# Patient Record
Sex: Male | Born: 1980 | Race: White | Hispanic: No | Marital: Single
Health system: Midwestern US, Community
[De-identification: ages and names within clinical notes are randomized; demographics above are authoritative.]

---

## 2004-12-27 IMAGING — CR DG LUMBAR SPINE COMPLETE 4+V
5 series · 5 of 5 positions shown · non-contrast
Comparison: None.

CLINICAL DATA: Low back pain, status post fall. 
 LUMBAR SPINE, COMPLETE

[view not recorded (1 of 5)]
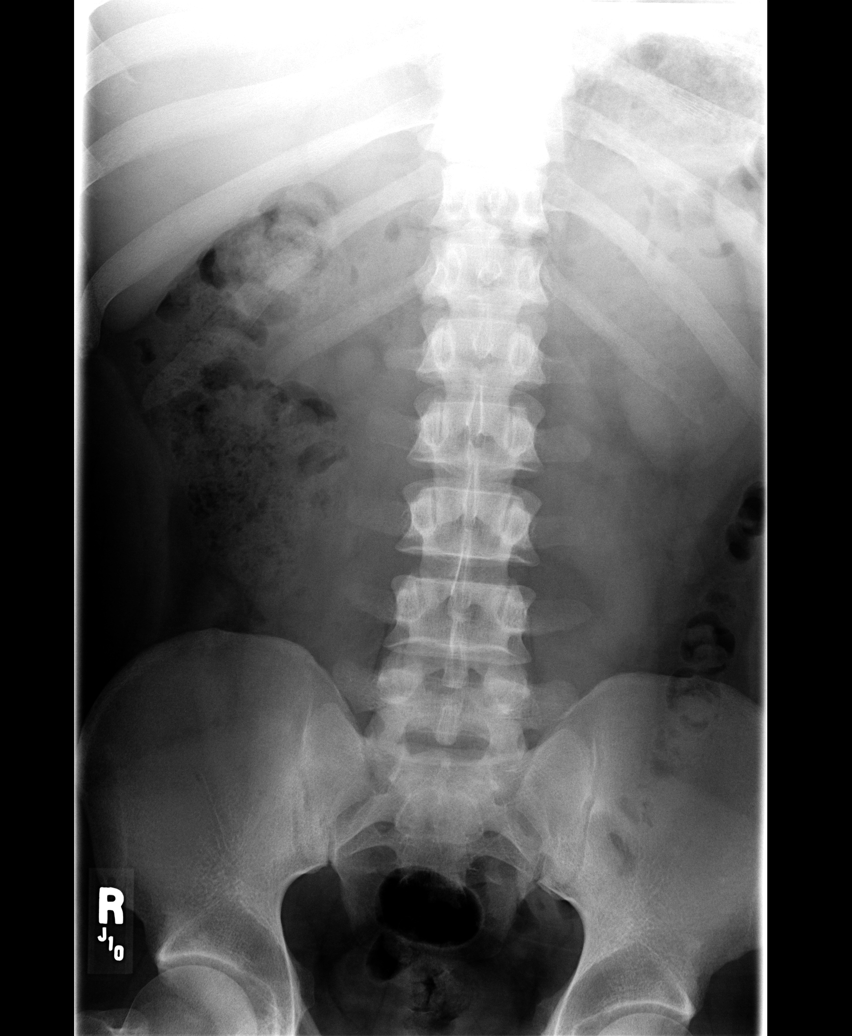

[view not recorded (2 of 5)]
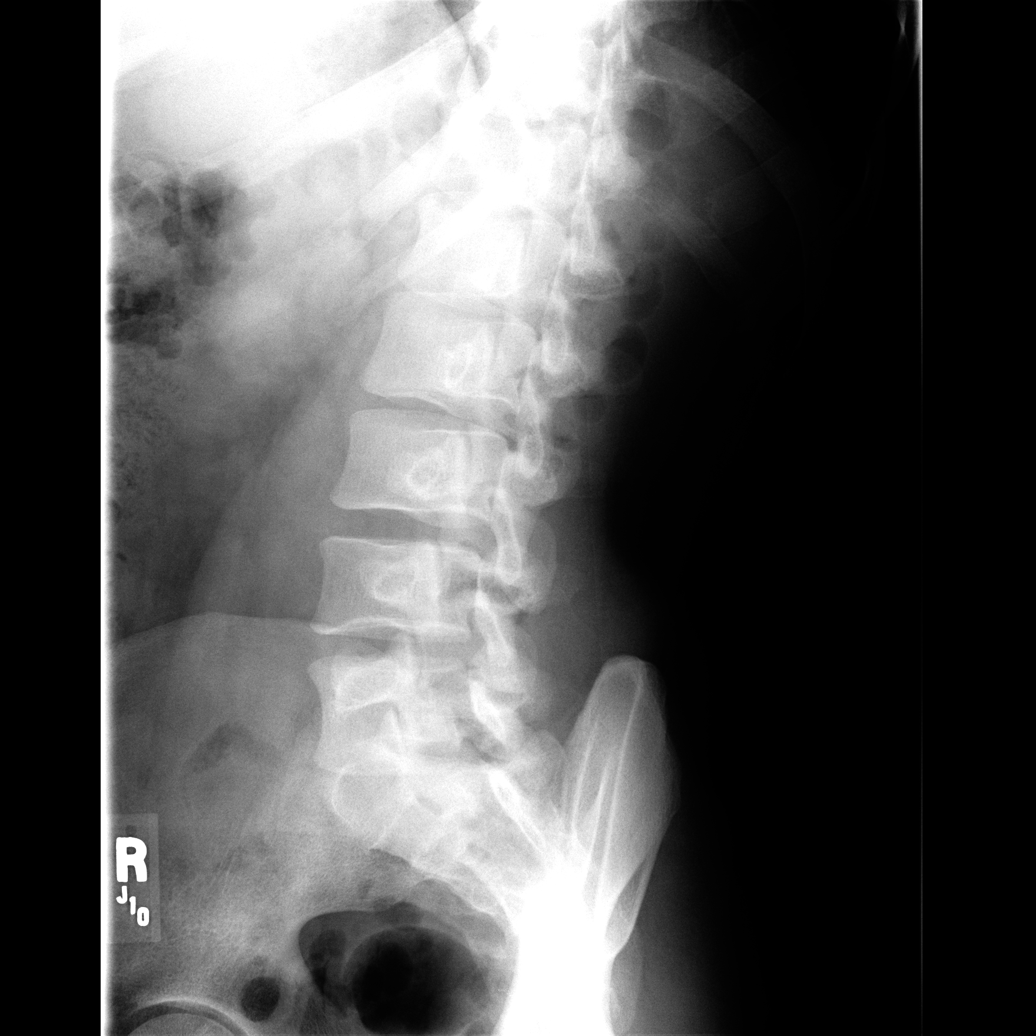

[view not recorded (3 of 5)]
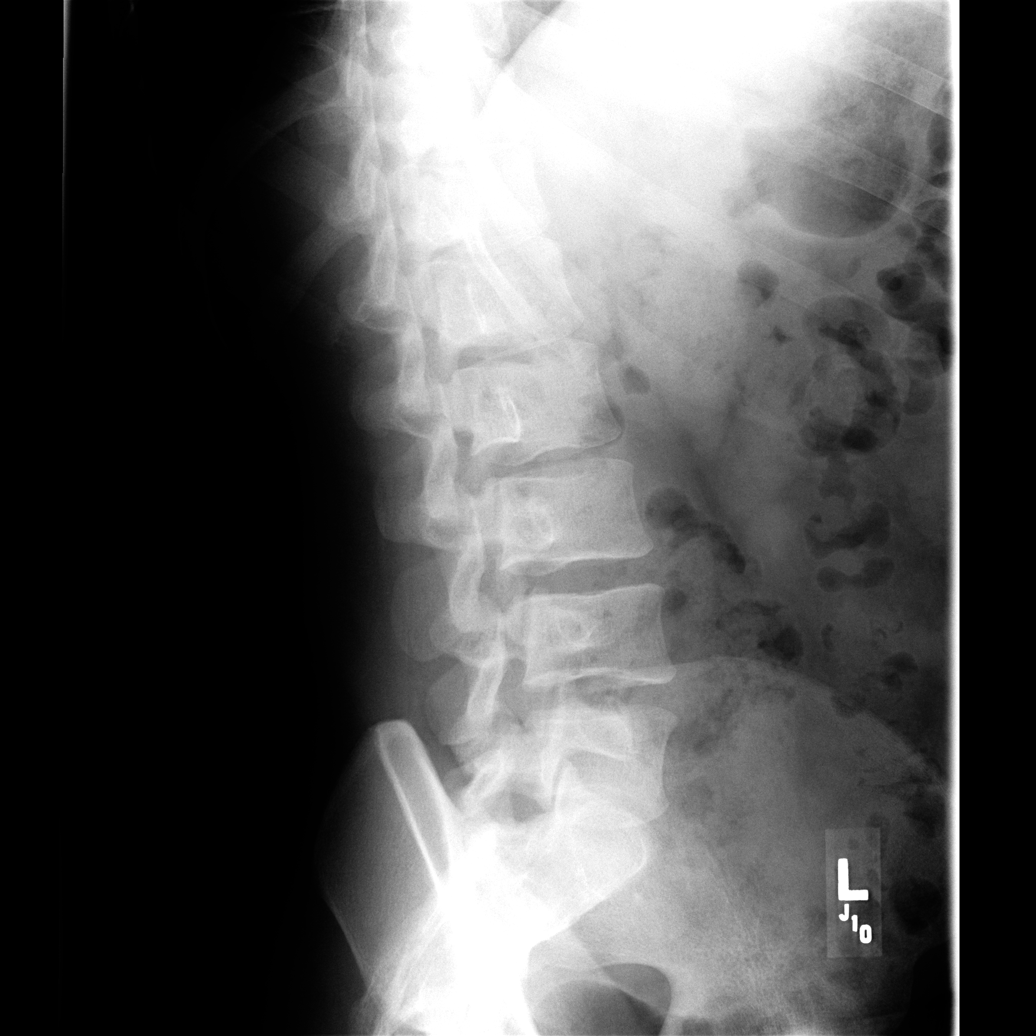

[view not recorded (4 of 5)]
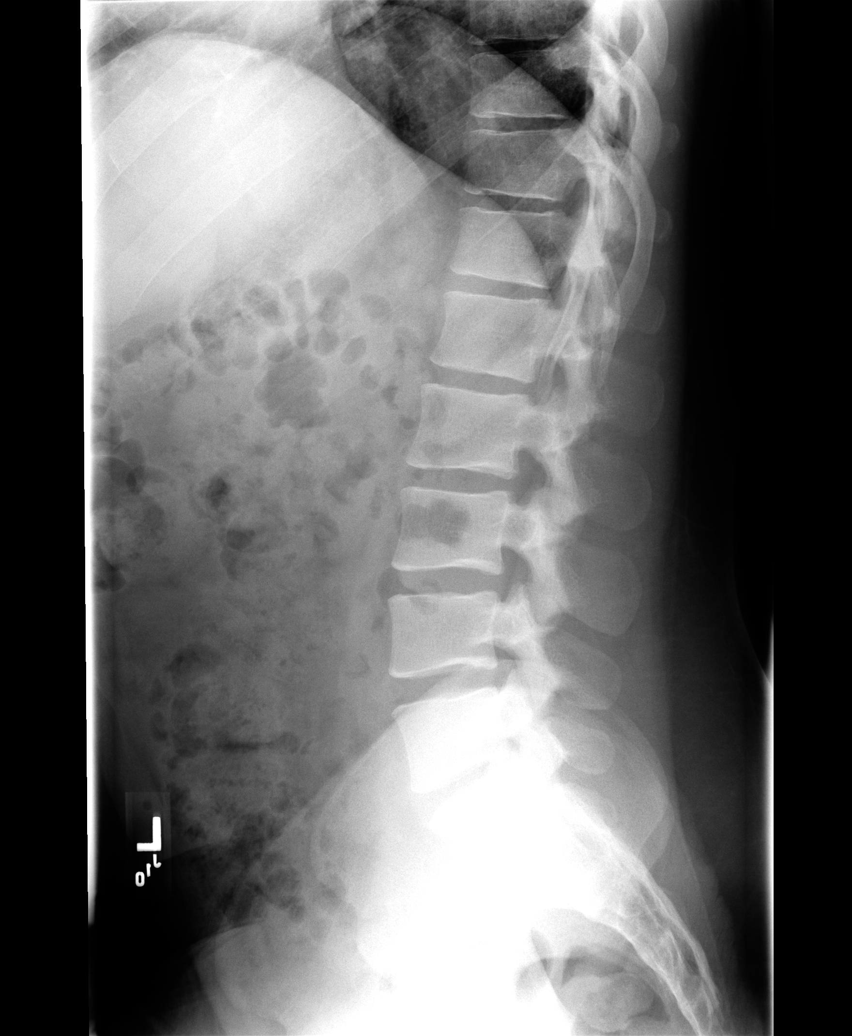

[view not recorded (5 of 5)]
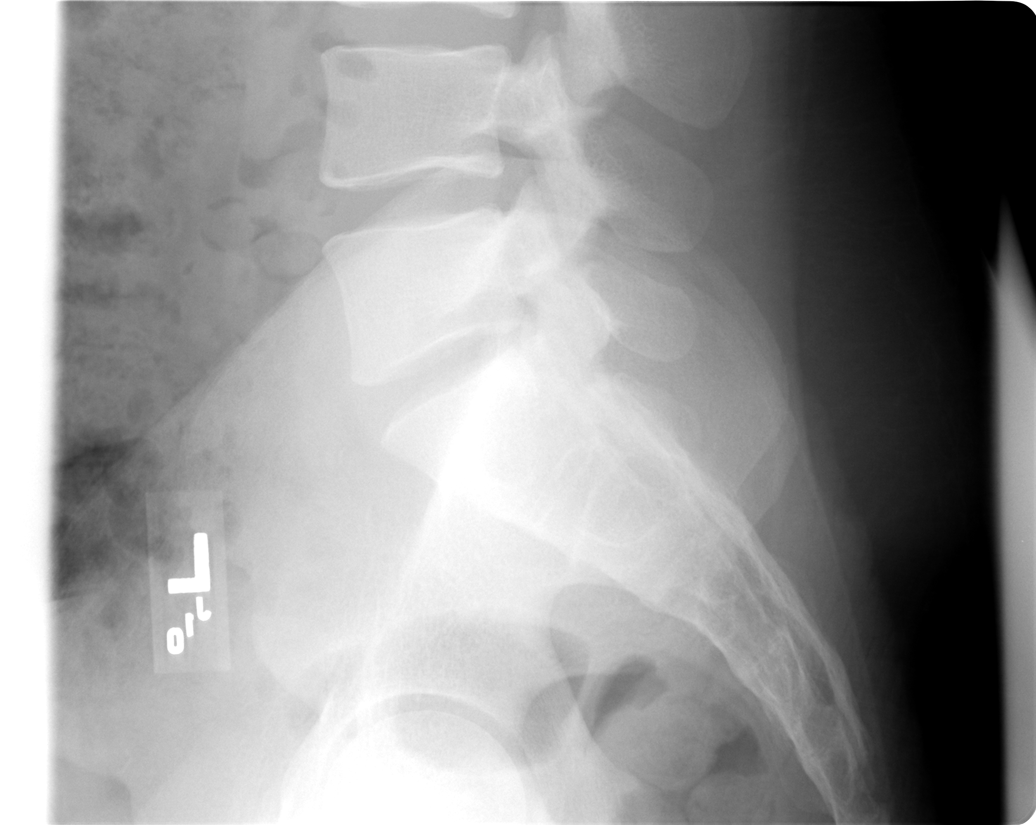

[5 of 5 positions shown; findings below may reference images not displayed]

There are five lumbar type vertebral bodies in anatomic alignment.  The disc spaces are preserved.  There is no evidence of acute fracture or dislocation. 
 IMPRESSION
 Normal exam.

## 2011-11-09 MED ORDER — HYDROMORPHONE (PF) 1 MG/ML IJ SOLN
1 mg/mL | INTRAMUSCULAR | Status: AC
Start: 2011-11-09 — End: 2011-11-09
  Administered 2011-11-09: 18:00:00 via INTRAMUSCULAR

## 2011-11-09 MED ORDER — IBUPROFEN 400 MG TAB
400 mg | ORAL | Status: AC
Start: 2011-11-09 — End: 2011-11-09
  Administered 2011-11-09: 18:00:00 via ORAL

## 2011-11-09 MED ORDER — HYDROMORPHONE (PF) 1 MG/ML IJ SOLN
1 mg/mL | INTRAMUSCULAR | Status: AC
Start: 2011-11-09 — End: 2011-11-09
  Administered 2011-11-09: 19:00:00 via INTRAMUSCULAR

## 2011-11-09 MED ORDER — OXYCODONE-ACETAMINOPHEN 5 MG-325 MG TAB
5-325 mg | ORAL_TABLET | Freq: Four times a day (QID) | ORAL | Status: AC | PRN
Start: 2011-11-09 — End: ?

## 2011-11-09 MED ORDER — IBUPROFEN 800 MG TAB
800 mg | ORAL_TABLET | Freq: Three times a day (TID) | ORAL | Status: AC | PRN
Start: 2011-11-09 — End: ?

## 2011-11-09 MED FILL — HYDROMORPHONE (PF) 1 MG/ML IJ SOLN: 1 mg/mL | INTRAMUSCULAR | Qty: 1
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
## 2011-11-09 NOTE — ED Notes (Signed)
Discharge instructions given to pt.  All questions answered and pt verbalized understanding.   V/S stable @ time of discharge.  Pt ambulatory out of unit.
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
## 2011-11-15 NOTE — ED Provider Notes (Signed)
I was personally available for consultation in the emergency department.  I have reviewed the chart and agree with the documentation recorded by the MLP, including the assessment, treatment plan, and disposition.  Barnett Elzey C Magdelena Kinsella, MD

## 2018-03-16 ENCOUNTER — Inpatient Hospital Stay
Admit: 2018-03-16 | Discharge: 2018-03-16 | Disposition: A | Discharge status: Home / Self Care | Payer: Medicaid Other | Attending: Emergency Medicine

## 2018-03-16 ENCOUNTER — Emergency Department: Admit: 2018-03-16 | Payer: Medicaid Other

## 2018-03-16 DIAGNOSIS — R1110 Vomiting, unspecified: Secondary | ICD-10-CM

## 2018-03-16 LAB — CBC WITH AUTO DIFFERENTIAL
Basophils %: 1 % (ref 0–2)
Basophils Absolute: 0.1 10*3/uL (ref 0.0–0.2)
Eosinophils %: 2 % (ref 1–4)
Eosinophils Absolute: 0.2 10*3/uL (ref 0.0–0.4)
Hematocrit: 48.8 % (ref 41–53)
Hemoglobin: 16.9 g/dL (ref 13.5–17.5)
Lymphocytes Absolute: 2.7 10*3/uL (ref 1.0–4.8)
Lymphocytes: 32 % (ref 24–44)
MCH: 31.4 pg (ref 26–34)
MCHC: 34.6 g/dL (ref 31–37)
MCV: 90.7 fL (ref 80–100)
MPV: 9.9 fL (ref 6.0–12.0)
Monocytes %: 11 % (ref 2–11)
Monocytes Absolute: 0.9 10*3/uL (ref 0.1–1.2)
Neutrophils Absolute: 4.5 10*3/uL (ref 1.8–7.7)
Platelets: 297 10*3/uL (ref 140–450)
RBC: 5.38 m/uL (ref 4.5–5.9)
RDW: 13.9 % (ref 12.5–15.4)
Seg Neutrophils: 54 % (ref 36–66)
WBC: 8.4 10*3/uL (ref 3.5–11.0)

## 2018-03-16 LAB — HEPATIC FUNCTION PANEL
ALT: 29 U/L (ref 5–41)
AST: 21 U/L (ref <40)
Albumin/Globulin Ratio: 1.3 (ref 1.0–2.5)
Albumin: 4.3 g/dL (ref 3.5–5.2)
Alkaline Phosphatase: 121 U/L (ref 40–129)
Bilirubin, Direct: 0.21 mg/dL (ref <0.31)
Bilirubin, Indirect: 0.72 mg/dL (ref 0.00–1.00)
Total Bilirubin: 0.93 mg/dL (ref 0.3–1.2)
Total Protein: 7.5 g/dL (ref 6.4–8.3)

## 2018-03-16 LAB — MICROSCOPIC URINALYSIS
Casts UA: 2 /[LPF]
Epithelial Cells, UA: 2 /[HPF]
RBC, UA: 0 /[HPF]
WBC, UA: 2 /[HPF]

## 2018-03-16 LAB — URINALYSIS WITH REFLEX TO CULTURE
Glucose, Ur: NEGATIVE
Leukocyte Esterase, Urine: NEGATIVE
Nitrite, Urine: NEGATIVE
Protein, UA: NEGATIVE — AB
Specific Gravity, UA: 1.015
Urine Hgb: NEGATIVE
Urobilinogen, Urine: ELEVATED — AB
pH, UA: 8

## 2018-03-16 LAB — BASIC METABOLIC PANEL
Anion Gap: 11 mmol/L (ref 9–17)
BUN: 12 mg/dL (ref 6–20)
CO2: 26 mmol/L (ref 20–31)
Calcium: 9.2 mg/dL (ref 8.6–10.4)
Chloride: 103 mmol/L (ref 98–107)
Creatinine: 0.83 mg/dL (ref 0.70–1.20)
GFR African American: >60 mL/min (ref >60)
GFR Non-African American: >60 mL/min (ref >60)
Glucose: 102 mg/dL — ABNORMAL HIGH (ref 70–99)
Potassium: 3.7 mmol/L (ref 3.7–5.3)
Sodium: 140 mmol/L (ref 135–144)

## 2018-03-16 LAB — AMYLASE: Amylase: 58 U/L (ref 28–100)

## 2018-03-16 LAB — LIPASE: Lipase: 17 U/L (ref 13–60)

## 2018-03-16 MED ORDER — ONDANSETRON 4 MG PO TBDP
4 MG | ORAL_TABLET | Freq: Three times a day (TID) | ORAL | 0 refills | Status: DC | PRN
Start: 2018-03-16 — End: 2018-04-11

## 2018-03-16 MED ORDER — KETOROLAC TROMETHAMINE 15 MG/ML IJ SOLN
15 MG/ML | Freq: Once | INTRAMUSCULAR | Status: AC
Start: 2018-03-16 — End: 2018-03-16
  Administered 2018-03-16: 14:00:00 15 mg via INTRAVENOUS

## 2018-03-16 MED ORDER — ONDANSETRON HCL 4 MG/2ML IJ SOLN
42 MG/2ML | Freq: Once | INTRAMUSCULAR | Status: AC
Start: 2018-03-16 — End: 2018-03-16
  Administered 2018-03-16: 14:00:00 4 mg via INTRAVENOUS

## 2018-03-16 MED ORDER — SODIUM CHLORIDE 0.9 % IV BOLUS
0.9 % | Freq: Once | INTRAVENOUS | Status: AC
Start: 2018-03-16 — End: 2018-03-16
  Administered 2018-03-16: 14:00:00 1000 mL via INTRAVENOUS

## 2018-03-16 MED ORDER — MORPHINE SULFATE 4 MG/ML IJ SOLN
4 MG/ML | Freq: Once | INTRAMUSCULAR | Status: AC
Start: 2018-03-16 — End: 2018-03-16
  Administered 2018-03-16: 15:00:00 4 mg via INTRAVENOUS

## 2018-03-16 MED ORDER — SODIUM CHLORIDE 0.9 % IV BOLUS
0.9 % | Freq: Once | INTRAVENOUS | Status: AC
Start: 2018-03-16 — End: 2018-03-16
  Administered 2018-03-16: 15:00:00 1000 mL via INTRAVENOUS

## 2018-03-16 MED ORDER — HYDROCODONE-ACETAMINOPHEN 5-325 MG PO TABS
5-325 MG | ORAL_TABLET | Freq: Four times a day (QID) | ORAL | 0 refills | Status: AC | PRN
Start: 2018-03-16 — End: 2018-03-19

## 2018-03-16 MED FILL — KETOROLAC TROMETHAMINE 15 MG/ML IJ SOLN: 15 mg/mL | INTRAMUSCULAR | Qty: 1

## 2018-03-16 MED FILL — MORPHINE SULFATE 4 MG/ML IJ SOLN: 4 mg/mL | INTRAMUSCULAR | Qty: 1

## 2018-03-16 MED FILL — ONDANSETRON HCL 4 MG/2ML IJ SOLN: 4 MG/2ML | INTRAMUSCULAR | Qty: 2

## 2018-03-16 NOTE — ED Notes (Signed)
Patient advised no driving s/p pain medication. Patient verbalized understanding and has ride home.   Patient cleared for discharge per MD. Patient discharge instructions explained, Rx given and explained to patient. Patient Verbalized understanding of all instructions and all patient questions answered to their satisfaction. Patient departs from ED in stable condition.        Deanne CofferJennifer N Poonam Woehrle, RN  03/16/18 1256

## 2018-03-16 NOTE — ED Provider Notes (Signed)
[facility]  eMERGENCY dEPARTMENT eNCOUnter      Pt Name: John Stafford  MRN: [id number]  Birthdate 1980/08/10  Date of evaluation: 03/16/2018      CHIEF COMPLAINT       Chief Complaint   Patient presents with   . Nausea   . Emesis   . Rib Pain         HISTORY OF PRESENT ILLNESS      The patient presents with nausea, vomiting, and left rib pain.  The patient has pain in the left side of the ribs that seems to be very musculoskeletal in nature.  His symptoms are going on for about 36 hours.  He started with vomiting and then now is having more pain in his flank whenever he moves or touches it.  He says he's had kidney stones but this seems to be different.  He denies dysuria or hematuria.  He denies fever.  He tried some Tylenol.  Nothing makes his symptoms better or worse otherwise.      REVIEW OF SYSTEMS       All systems reviewed and negative unless noted in HPI.  The patient denies fever or constitutional symptoms.    Denies vision change.   Denies any sore throat or rhinorrhea.    Denies any neck pain or stiffness.    Denies chest pain or shortness of breath.  Pain in left lower ribs and flank.  Vomiting and left sided abdominal and flank pain.  Denies any dysuria.  Denies urinary frequency or hematuria.    Denies musculoskeletal injury or pain.   Denies any weakness, numbness or focal neurologic deficit.    Denies any skin rash or edema.    No recent psychiatric issues.   No easy bruising or bleeding.   Denies any polyuria, polydypsia or history of immunocompromise.      PAST MEDICAL HISTORY    has a past medical history of GERD (gastroesophageal reflux disease).    SURGICAL HISTORY      has no past surgical history on file.    CURRENT MEDICATIONS       Previous Medications    No medications on file       ALLERGIES     has No Known Allergies.    FAMILY HISTORY     has no family status information on file.      family history is not on file.    SOCIAL HISTORY      reports that he has been  smoking. He has never used smokeless tobacco. He reports that he has current or past drug history. Drug: Marijuana. He reports that he does not drink alcohol.    PHYSICAL EXAM     INITIAL VITALS:  height is 5\' 4"  (1.626 m) and weight is 99.8 kg (220 lb). His oral temperature is 98.5 F (36.9 C). His blood pressure is 145/87 (abnormal) and his pulse is 96. His respiration is 16 and oxygen saturation is 95%.      Patient is alert and oriented, in mild to moderate distress due to pain.   HEENT is atraumatic.    Pupils are PERRL at 5 mm.  Mucous membranes moist.    Neck is supple with no lymphadenopathy.  No JVD.  No meningismus.    Heart sounds regular rate and rhythm with no gallops, murmurs, or rubs.  Pain in left posterior ribs/flank to palpation and with movement.  No subcutaneous emphysema or bruising.  Lungs clear,  no wheezes, rales or rhonchi.    Abdomen: soft, with tenderness in left upper and lower quadrants.  No pulsatile mass.  Normal bowel sounds are noted.  No rebound or guarding.    Musculoskeletal exam shows no evidence of trauma.  Normal distal pulses in all extremities.  Skin: no rash or edema.    Neurological exam reveals cranial nerves 2 through 12 grossly intact.  Patient has equal grips and normal deep tendon reflexes.    Psychiatric: no hallucinations or suicidal ideation.   Lymphatics.:  No lymphadenopathy.       DIFFERENTIAL DIAGNOSIS/ MDM:     Colitis, gastroenteritis, thoracici strain, renal colic, dehydration, ACS    DIAGNOSTIC RESULTS       RADIOLOGY:   I directly visualized the following  images and reviewed the radiologist interpretations:  CT ABDOMEN PELVIS WO CONTRAST   Preliminary Result   No acute abnormality identified in the abdomen or pelvis.  In particular, no   calcified renal stones or hydronephrosis.      Colonic diverticulosis.      Nonspecific subtle slight prominence of submucosal fat in bowel loops as   discussed above.              CT ABDOMEN PELVIS WO CONTRAST  (Preliminary result)   Result time 03/16/18 10:41:55   Preliminary result by Harlene Ramusavi P Kodali, MD (03/16/18 10:41:55)                Impression:    No acute abnormality identified in the abdomen or pelvis. In particular, no  calcified renal stones or hydronephrosis.    Colonic diverticulosis.    Nonspecific subtle slight prominence of submucosal fat in bowel loops as  discussed above.            Narrative:    EXAMINATION:  CT OF THE ABDOMEN AND PELVIS WITHOUT CONTRAST 03/16/2018 10:19 am    TECHNIQUE:  CT of the abdomen and pelvis was performed without the administration of  intravenous contrast. Multiplanar reformatted images are provided for review.  Dose modulation, iterative reconstruction, and/or weight based adjustment of  the mA/kV was utilized to reduce the radiation dose to as low as reasonably  achievable.    COMPARISON:  None.    HISTORY:  ORDERING SYSTEM PROVIDED HISTORY: left flank pain  TECHNOLOGIST PROVIDED HISTORY:  Reason for Exam: Nausea, vomiting, left sided abdominal/flank pain  Acuity: Acute  Type of Exam: Initial    FINDINGS:  Lower Chest: Mild atelectasis/scarring.    Organs: Evaluation of solid abdominal organs is limited by lack of  intravenous contrast. No acute abnormality of the liver, spleen, adrenal  glands, pancreas (with some fatty change), or gallbladder. No calcified  renal stones or hydronephrosis of either kidney. Minimal symmetric  perinephric soft tissue stranding. The ureters are not dilated.    GI/Bowel: Diverticulosis of the sigmoid colon without regional inflammatory  changes or focal fluid collections identified. There are few tiny  pericolonic nodules/lymph nodes in the region. No signs of bowel  obstruction. No CT evidence of appendicitis. Somewhat prominent submucosal  fat scattered in small bowel loops, mostly in the right lower quadrant. This  is a nonspecific finding that can be seen with chronic changes of  inflammatory bowel disease but can also be a normal  variant related to  obesity. Currently no regional inflammatory changes are seen.    Pelvis: Small fat-containing inguinal hernias, more prominent on the left.  No free fluid in the pelvis. No significant  bulky adenopathy. Somewhat  prominent seminal vesicles    Peritoneum/Retroperitoneum: Small fat-containing umbilical hernia. No  evidence of abdominal aortic aneurysm. Scattered nonenlarged lymph nodes  without significant bulky adenopathy.    Bones/Soft Tissues: Mild questionable sclerosis by the sacroiliac joints. No  acute osseous abnormality.               Preliminary result by Harlene Ramus, MD (03/16/18 10:39:12)                Impression:    No acute abnormality identified in the abdomen or pelvis. In particular no  calcified renal stones or hydronephrosis.    Colonic diverticulosis.    Nonspecific subtle slight prominence of submucosal fat in bowel loops as  discussed above.                   LABS:  Results for orders placed or performed during the hospital encounter of 03/16/18   CBC Auto Differential   Result Value Ref Range    WBC 8.4 3.5 - 11.0 k/uL    RBC 5.38 4.5 - 5.9 m/uL    Hemoglobin 16.9 13.5 - 17.5 g/dL    Hematocrit 16.1 41 - 53 %    MCV 90.7 80 - 100 fL    MCH 31.4 26 - 34 pg    MCHC 34.6 31 - 37 g/dL    RDW 09.6 04.5 - 40.9 %    Platelets 297 140 - 450 k/uL    MPV 9.9 6.0 - 12.0 fL    NRBC Automated NOT REPORTED per 100 WBC    Differential Type NOT REPORTED     Seg Neutrophils 54 36 - 66 %    Lymphocytes 32 24 - 44 %    Monocytes 11 2 - 11 %    Eosinophils % 2 1 - 4 %    Basophils 1 0 - 2 %    Immature Granulocytes NOT REPORTED 0 %    Segs Absolute 4.50 1.8 - 7.7 k/uL    Absolute Lymph # 2.70 1.0 - 4.8 k/uL    Absolute Mono # 0.90 0.1 - 1.2 k/uL    Absolute Eos # 0.20 0.0 - 0.4 k/uL    Basophils Absolute 0.10 0.0 - 0.2 k/uL    Absolute Immature Granulocyte NOT REPORTED 0.00 - 0.30 k/uL    WBC Morphology NOT REPORTED     RBC Morphology NOT REPORTED     Platelet Estimate NOT REPORTED     Basic Metabolic Panel   Result Value Ref Range    Glucose 102 (H) 70 - 99 mg/dL    BUN 12 6 - 20 mg/dL    CREATININE 8.11 9.14 - 1.20 mg/dL    Bun/Cre Ratio NOT REPORTED 9 - 20    Calcium 9.2 8.6 - 10.4 mg/dL    Sodium 782 956 - 213 mmol/L    Potassium 3.7 3.7 - 5.3 mmol/L    Chloride 103 98 - 107 mmol/L    CO2 26 20 - 31 mmol/L    Anion Gap 11 9 - 17 mmol/L    GFR Non-African American >60 >60 mL/min    GFR African American >60 >60 mL/min    GFR Comment          GFR Staging NOT REPORTED    Hepatic Function Panel   Result Value Ref Range    Alb 4.3 3.5 - 5.2 g/dL    Alkaline Phosphatase 121 40 - 129 U/L  ALT 29 5 - 41 U/L    AST 21 <40 U/L    Total Bilirubin 0.93 0.3 - 1.2 mg/dL    Bilirubin, Direct 9.14 <0.31 mg/dL    Bilirubin, Indirect 0.72 0.00 - 1.00 mg/dL    Total Protein 7.5 6.4 - 8.3 g/dL    Globulin NOT REPORTED 1.5 - 3.8 g/dL    Albumin/Globulin Ratio 1.3 1.0 - 2.5   Lipase   Result Value Ref Range    Lipase 17 13 - 60 U/L   Amylase   Result Value Ref Range    Amylase 58 28 - 100 U/L   Urinalysis Reflex to Culture   Result Value Ref Range    Color, UA DARK YELLOW (A) YELLOW    Turbidity UA CLEAR CLEAR    Glucose, Ur NEGATIVE NEGATIVE    Bilirubin Urine SMALL (A) NEGATIVE    Ketones, Urine TRACE (A) NEGATIVE    Specific Gravity, UA 1.015     Urine Hgb NEGATIVE NEGATIVE    pH, UA 8.0     Protein, UA NEGATIVE  Verified by sulfosalicylic acid test. (A) NEGATIVE    Urobilinogen, Urine ELEVATED (A) Normal    Nitrite, Urine NEGATIVE NEGATIVE    Leukocyte Esterase, Urine NEGATIVE NEGATIVE    Urinalysis Comments NOT REPORTED    Microscopic Urinalysis   Result Value Ref Range    -          WBC, UA 2 TO 5 /HPF    RBC, UA 0 TO 2 /HPF    Casts UA 2 TO 5 /LPF    Casts UA HYALINE /LPF    Crystals UA NOT REPORTED None /HPF    Epithelial Cells UA 2 TO 5 /HPF    Renal Epithelial, Urine NOT REPORTED 0 /HPF    Bacteria, UA NOT REPORTED None    Mucus, UA 2+ (A) None    Trichomonas, UA NOT REPORTED None    Amorphous, UA 1+  (A) None    Other Observations UA (A) NOT REQ.     Utilizing a urinalysis as the only screening method to exclude a potential uropathogen can be unreliable in many patient populations.  Rapid screening tests are less sensitive than culture and if UTI is a clinical possibility, culture should be considered despite a negative urinalysis.    Yeast, UA NOT REPORTED None         EMERGENCY DEPARTMENT COURSE:   Vitals:    Vitals:    03/16/18 0939   BP: (!) 145/87   Pulse: 96   Resp: 16   Temp: 98.5 F (36.9 C)   TempSrc: Oral   SpO2: 95%   Weight: 99.8 kg (220 lb)   Height: 5\' 4"  (1.626 m)     -------------------------  BP: (!) 145/87, Temp: 98.5 F (36.9 C), Pulse: 96, Resp: 16      Re-evaluation Notes    I think the patient's flank and chest wall pain are musculoskeletal in nature.  I will treat him with pain medication.  His labs are reassuring and there is no evidence of a diverticulitis or urinary infection.  The patient is asked to follow-up with a PCP.  I've given referral.  He is discharged in good condition.    Controlled Substance Monitoring:    Acute and Chronic Pain Monitoring:   No flowsheet data found.        FINAL IMPRESSION      1. Vomiting in adult    2. Thoracic myofascial  strain, initial encounter          DISPOSITION/PLAN   DISPOSITION        Condition on Disposition    good    PATIENT REFERRED TO:  Laveda Abbe, MD  [address]  [place]  [place] [address]  [phone]    In 3 days        DISCHARGE MEDICATIONS:  New Prescriptions    HYDROCODONE-ACETAMINOPHEN (NORCO) 5-325 MG PER TABLET    Take 1 tablet by mouth every 6 hours as needed for Pain for up to 3 days.    ONDANSETRON (ZOFRAN ODT) 4 MG DISINTEGRATING TABLET    Take 1 tablet by mouth every 8 hours as needed for Nausea       (Please note that portions of this note were completed with a voice recognition program.  Efforts were made to edit the dictations but occasionally words are mis-transcribed.)    Luther Bradley,, MD   Attending  Emergency Physician         Luther Bradley, MD  03/16/18 [phone]

## 2018-03-16 NOTE — Discharge Instructions (Signed)
May use Norco for pain.  Zofran as needed for nausea.  Drink plenty of fluids.  Return for worsening pain, fever, or if worse in any way.

## 2018-03-16 NOTE — ED Notes (Signed)
Pt ambulated to room 9. C/o left flank pain and nausea/vomiting. Pt reports onset of nausea and vomiting yesterday and onset of flank pain this am. Pt denies any chest pain or SOB, denies any dysuria or hematuria. Pt reports normal bowel movements and last one was last pm. Pt alert and oriented speaking sentences no distress noted. Pt notably in pain with movement, deep breathing. Pt bowel sounds active x 4 quadrants, abd soft tender in left flank and RLQ abd with palpation.Pt also reports tenderness with palpation of left lumbar area of back. Pt able to ambulate and transfer, skin pink warm and dry, mucous membranes pink and moist.      Deanne Coffer, RN  03/16/18 1001

## 2018-04-11 ENCOUNTER — Inpatient Hospital Stay
Admit: 2018-04-11 | Discharge: 2018-04-11 | Disposition: A | Discharge status: Home / Self Care | Payer: Medicaid Other | Attending: Emergency Medicine

## 2018-04-11 DIAGNOSIS — M545 Low back pain: Secondary | ICD-10-CM

## 2018-04-11 LAB — URINALYSIS WITH MICROSCOPIC
Blood, Urine: NEGATIVE
Glucose, Ur: NEGATIVE mg/dL
Leukocyte Esterase, Urine: NEGATIVE
Nitrite, Urine: NEGATIVE
Specific Gravity, UA: 1.025 (ref 1.005–1.030)
Urobilinogen, Urine: 2 U/dL — AB (ref <2.0)
pH, UA: 6.5 (ref 5.0–9.0)

## 2018-04-11 MED ORDER — OXYCODONE-ACETAMINOPHEN 5-325 MG PO TABS
5-325 MG | Freq: Once | ORAL | Status: AC
Start: 2018-04-11 — End: 2018-04-11
  Administered 2018-04-11: 15:00:00 1 via ORAL

## 2018-04-11 MED ORDER — ORPHENADRINE CITRATE 30 MG/ML IJ SOLN
30 MG/ML | Freq: Once | INTRAMUSCULAR | Status: AC
Start: 2018-04-11 — End: 2018-04-11
  Administered 2018-04-11: 14:00:00 60 mg via INTRAMUSCULAR

## 2018-04-11 MED ORDER — ORPHENADRINE CITRATE ER 100 MG PO TB12
100 MG | ORAL_TABLET | Freq: Two times a day (BID) | ORAL | 0 refills | Status: AC
Start: 2018-04-11 — End: 2018-04-16

## 2018-04-11 MED ORDER — KETOROLAC TROMETHAMINE 30 MG/ML IJ SOLN
30 MG/ML | Freq: Once | INTRAMUSCULAR | Status: AC
Start: 2018-04-11 — End: 2018-04-11
  Administered 2018-04-11: 14:00:00 30 mg via INTRAMUSCULAR

## 2018-04-11 MED FILL — KETOROLAC TROMETHAMINE 30 MG/ML IJ SOLN: 30 mg/mL | INTRAMUSCULAR | Qty: 1

## 2018-04-11 MED FILL — OXYCODONE-ACETAMINOPHEN 5-325 MG PO TABS: 5-325 mg | ORAL | Qty: 1

## 2018-04-11 MED FILL — ORPHENADRINE CITRATE 30 MG/ML IJ SOLN: 30 mg/mL | INTRAMUSCULAR | Qty: 2

## 2018-04-11 NOTE — ED Provider Notes (Signed)
HPI:  04/11/18, Time: 9:26 AM         John Stafford is a 37 y.o. male presenting to the ED for back pain beginning yesterday.  Patient states that she is having back pain diffusely in his lower back radiating into his right buttocks and right upper leg.  Pain is worse with laying on it and walking.  Denies fall or trauma.  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  States he has never had pain like this before.  Took aspirin overnight with minimal relief.  Denies bowel or bladder incontinence, saddle anesthesia, numbness or weakness in his extremities, fevers, or IV drug use.  No abdominal pain, dysuria, hematuria, chest pain, shortness of breath, nausea, vomiting, or diarrhea.  No history of kidney stones.    Review of Systems:   Pertinent positives and negatives are stated within HPI, all other systems reviewed and are negative.          --------------------------------------------- PAST HISTORY ---------------------------------------------  Past Medical History:  has a past medical history of GERD (gastroesophageal reflux disease).    Past Surgical History:  has no past surgical history on file.    Social History:  reports that he has been smoking. He has never used smokeless tobacco. He reports that he has current or past drug history. Drug: Marijuana. He reports that he does not drink alcohol.    Family History: family history is not on file.     The patient's home medications have been reviewed.    Allergies: Patient has no known allergies.    -------------------------------------------------- RESULTS -------------------------------------------------  All laboratory and radiology results have been personally reviewed by myself   LABS:  Results for orders placed or performed during the hospital encounter of 04/11/18   Urinalysis with Microscopic   Result Value Ref Range    Color, UA DARK YELLOW (A) Straw/Yellow    Clarity, UA Clear Clear    Glucose, Ur Negative Negative mg/dL    Bilirubin Urine SMALL (A) Negative     Ketones, Urine TRACE (A) Negative mg/dL    Specific Gravity, UA 1.025 1.005 - 1.030    Blood, Urine Negative Negative    pH, UA 6.5 5.0 - 9.0    Protein, UA TRACE Negative mg/dL    Urobilinogen, Urine 2.0 (A) <2.0 E.U./dL    Nitrite, Urine Negative Negative    Leukocyte Esterase, Urine Negative Negative    WBC, UA NONE 0 - 5 /HPF    RBC, UA NONE 0 - 2 /HPF    Bacteria, UA NONE /HPF       RADIOLOGY:  Interpreted by Radiologist.  No orders to display       ------------------------- NURSING NOTES AND VITALS REVIEWED ---------------------------   The nursing notes within the ED encounter and vital signs as below have been reviewed.   BP 130/88   Pulse 90   Temp 97.8 F (36.6 C) (Oral)   Resp 16   Ht 5\' 4"  (1.626 m)   Wt 220 lb (99.8 kg)   SpO2 98%   BMI 37.76 kg/m   Oxygen Saturation Interpretation: Normal      ---------------------------------------------------PHYSICAL EXAM--------------------------------------      Constitutional/General: Alert and oriented x3, well appearing, non toxic in NAD  Head: Normocephalic and atraumatic  Eyes: PERRL, EOMI  Mouth: Oropharynx clear, handling secretions, no trismus  Neck: Supple, full ROM,   Pulmonary: Lungs clear to auscultation bilaterally, no wheezes, rales, or rhonchi. Not in respiratory distress  Cardiovascular:  Regular  rate and rhythm, no murmurs, gallops, or rubs. 2+ distal pulses  Abdomen: Soft, non tender, non distended,   Extremities: Moves all extremities x 4. Warm and well perfused  Back: Diffuse tenderness to lumbar paraspinal muscles  Skin: warm and dry without rash  Neurologic: GCS 15, 5/5 strength in all extremities.  Normal sensation in all extremities.  Psych: Normal Affect      ------------------------------ ED COURSE/MEDICAL DECISION MAKING----------------------  Medications   ketorolac (TORADOL) injection 30 mg (30 mg Intramuscular Given 04/11/18 1000)   orphenadrine (NORFLEX) injection 60 mg (60 mg Intramuscular Given 04/11/18 1000)    oxyCODONE-acetaminophen (PERCOCET) 5-325 MG per tablet 1 tablet (1 tablet Oral Given 04/11/18 1041)       Medical Decision Making/ED COURSE:   Patient is a 37 year old male presenting with back pain.  He is hemodynamically stable and well-appearing on exam.  He had diffuse tenderness to his lower back.  No history of trauma.  No midline spinal tenderness.  No signs or symptoms of cord compression.  Treated symptomatically with Norflex and Toradol.    ED Course as of Apr 11 1121   Tue Apr 11, 2018   1024 Bedside ultrasound performed. No hydronephrosis.    [JA]   1025 Pain is moderately improved with Toradol and Norflex.  He is requesting additional pain medication, the Percocet was ordered.  Urinalysis is unremarkable without evidence of hematuria or infection.  No hydronephrosis on bedside ultrasound.  Symptoms are more consistent with musculoskeletal pain.  I do not suspect kidney stone.    [JA]      ED Course User Index  [JA] Minus Breeding, MD           Counseling:   The emergency provider has spoken with the patient and discussed today's results, in addition to providing specific details for the plan of care and counseling regarding the diagnosis and prognosis.  Questions are answered at this time and they are agreeable with the plan.      --------------------------------- IMPRESSION AND DISPOSITION ---------------------------------    IMPRESSION  1. Acute bilateral low back pain without sciatica        DISPOSITION  Disposition: Discharge to home  Patient condition is stable      NOTE: This report was transcribed using voice recognition software. Every effort was made to ensure accuracy; however, inadvertent computerized transcription errors may be present       Minus Breeding, MD  04/11/18 1122

## 2018-04-12 ENCOUNTER — Inpatient Hospital Stay
Admit: 2018-04-12 | Discharge: 2018-04-12 | Disposition: A | Discharge status: Home / Self Care | Payer: Medicaid Other | Attending: Emergency Medicine

## 2018-04-12 ENCOUNTER — Emergency Department: Admit: 2018-04-12 | Payer: Medicaid Other

## 2018-04-12 DIAGNOSIS — K5732 Diverticulitis of large intestine without perforation or abscess without bleeding: Secondary | ICD-10-CM

## 2018-04-12 LAB — BASIC METABOLIC PANEL
Anion Gap: 12 mmol/L (ref 7–16)
BUN: 10 mg/dL (ref 6–20)
CO2: 27 mmol/L (ref 22–29)
Calcium: 9 mg/dL (ref 8.6–10.2)
Chloride: 102 mmol/L (ref 98–107)
Creatinine: 0.8 mg/dL (ref 0.7–1.2)
GFR African American: >60
GFR Non-African American: >60 mL/min/{1.73_m2} (ref >=60)
Glucose: 90 mg/dL (ref 74–99)
Potassium: 3.7 mmol/L (ref 3.5–5.0)
Sodium: 141 mmol/L (ref 132–146)

## 2018-04-12 LAB — LACTIC ACID: Lactic Acid: 1.2 mmol/L (ref 0.5–2.2)

## 2018-04-12 LAB — CBC WITH AUTO DIFFERENTIAL
Basophils %: 0.7 % (ref 0.0–2.0)
Basophils Absolute: 0.06 E9/L (ref 0.00–0.20)
Eosinophils %: 2.6 % (ref 0.0–6.0)
Eosinophils Absolute: 0.23 E9/L (ref 0.05–0.50)
Hematocrit: 48 % (ref 37.0–54.0)
Hemoglobin: 15.7 g/dL (ref 12.5–16.5)
Immature Granulocytes #: 0.02 E9/L
Immature Granulocytes %: 0.2 % (ref 0.0–5.0)
Lymphocytes %: 32.4 % (ref 20.0–42.0)
Lymphocytes Absolute: 2.86 E9/L (ref 1.50–4.00)
MCH: 30.4 pg (ref 26.0–35.0)
MCHC: 32.7 % (ref 32.0–34.5)
MCV: 93 fL (ref 80.0–99.9)
MPV: 11.6 fL (ref 7.0–12.0)
Monocytes %: 9.5 % (ref 2.0–12.0)
Monocytes Absolute: 0.84 E9/L (ref 0.10–0.95)
Neutrophils %: 54.6 % (ref 43.0–80.0)
Neutrophils Absolute: 4.81 E9/L (ref 1.80–7.30)
Platelets: 291 E9/L (ref 130–450)
RBC: 5.16 E12/L (ref 3.80–5.80)
RDW: 13.2 fL (ref 11.5–15.0)
WBC: 8.8 E9/L (ref 4.5–11.5)

## 2018-04-12 LAB — URINALYSIS WITH MICROSCOPIC
Bilirubin Urine: NEGATIVE
Blood, Urine: NEGATIVE
Glucose, Ur: NEGATIVE mg/dL
Ketones, Urine: NEGATIVE mg/dL
Leukocyte Esterase, Urine: NEGATIVE
Nitrite, Urine: NEGATIVE
Protein, UA: NEGATIVE mg/dL
Specific Gravity, UA: 1.02 (ref 1.005–1.030)
Urobilinogen, Urine: 1 U/dL (ref <2.0)
pH, UA: 6.5 (ref 5.0–9.0)

## 2018-04-12 MED ORDER — METRONIDAZOLE 500 MG PO TABS
500 MG | Freq: Once | ORAL | Status: AC
Start: 2018-04-12 — End: 2018-04-12
  Administered 2018-04-12: 18:00:00 500 mg via ORAL

## 2018-04-12 MED ORDER — IOPAMIDOL 76 % IV SOLN
76 % | Freq: Once | INTRAVENOUS | Status: AC | PRN
Start: 2018-04-12 — End: 2018-04-12
  Administered 2018-04-12: 17:00:00 110 mL via INTRAVENOUS

## 2018-04-12 MED ORDER — CEFDINIR 300 MG PO CAPS
300 MG | ORAL_CAPSULE | Freq: Two times a day (BID) | ORAL | 0 refills | Status: AC
Start: 2018-04-12 — End: 2018-04-19

## 2018-04-12 MED ORDER — NORMAL SALINE FLUSH 0.9 % IV SOLN
0.9 % | INTRAVENOUS | Status: DC | PRN
Start: 2018-04-12 — End: 2018-04-12
  Administered 2018-04-12: 17:00:00 10 mL via INTRAVENOUS

## 2018-04-12 MED ORDER — KETOROLAC TROMETHAMINE 30 MG/ML IJ SOLN
30 MG/ML | Freq: Once | INTRAMUSCULAR | Status: AC
Start: 2018-04-12 — End: 2018-04-12
  Administered 2018-04-12: 17:00:00 15 mg via INTRAVENOUS

## 2018-04-12 MED ORDER — CEFDINIR 300 MG PO CAPS
300 MG | Freq: Once | ORAL | Status: AC
Start: 2018-04-12 — End: 2018-04-12

## 2018-04-12 MED ORDER — MORPHINE SULFATE (PF) 8 MG/ML IV SOLN
8 MG/ML | Freq: Once | INTRAVENOUS | Status: AC
Start: 2018-04-12 — End: 2018-04-12
  Administered 2018-04-12: 18:00:00 8 mg via INTRAVENOUS

## 2018-04-12 MED ORDER — SODIUM CHLORIDE 0.9 % IV BOLUS
0.9 % | Freq: Once | INTRAVENOUS | Status: AC
Start: 2018-04-12 — End: 2018-04-12
  Administered 2018-04-12: 17:00:00 1000 mL via INTRAVENOUS

## 2018-04-12 MED ORDER — METRONIDAZOLE 500 MG PO TABS
500 MG | ORAL_TABLET | Freq: Three times a day (TID) | ORAL | 0 refills | Status: AC
Start: 2018-04-12 — End: 2018-04-19

## 2018-04-12 MED ORDER — CEFDINIR 300 MG PO CAPS
300 MG | Freq: Two times a day (BID) | ORAL | Status: DC
Start: 2018-04-12 — End: 2018-04-12

## 2018-04-12 MED ORDER — HYDROCODONE-ACETAMINOPHEN 5-325 MG PO TABS
5-325 MG | ORAL_TABLET | Freq: Four times a day (QID) | ORAL | 0 refills | Status: AC | PRN
Start: 2018-04-12 — End: 2018-04-15

## 2018-04-12 MED ORDER — CEFDINIR 300 MG PO CAPS
300 MG | ORAL | Status: AC
Start: 2018-04-12 — End: 2018-04-12
  Administered 2018-04-12: 19:00:00 300 via ORAL

## 2018-04-12 MED FILL — METRONIDAZOLE 500 MG PO TABS: 500 mg | ORAL | Qty: 1

## 2018-04-12 MED FILL — KETOROLAC TROMETHAMINE 30 MG/ML IJ SOLN: 30 mg/mL | INTRAMUSCULAR | Qty: 1

## 2018-04-12 MED FILL — MORPHINE SULFATE (PF) 8 MG/ML IV SOLN: 8 mg/mL | INTRAVENOUS | Qty: 1

## 2018-04-12 MED FILL — NORMAL SALINE FLUSH 0.9 % IV SOLN: 0.9 % | INTRAVENOUS | Qty: 10

## 2018-04-12 MED FILL — CEFDINIR 300 MG PO CAPS: 300 mg | ORAL | Qty: 1

## 2018-04-12 NOTE — Discharge Instructions (Addendum)
Take medications as directed.  Return to the emergency department for worsening symptoms.

## 2018-04-12 NOTE — ED Provider Notes (Signed)
The patient is a 37 year old male with a history of GERD who presents to the emergency department with complaint of left flank pain left lower quadrant abdominal pain that is been worsening over the past 2 to 3 days.  He was seen 2 days ago for this flank pain and diagnosed with flank pain.  He was discharged home with muscle relaxers.  He had an unremarkable bedside ultrasound showing no hydronephrosis at that time.  On 03-16-18 the patient had a noncontrast CT abdomen pelvis that showed diverticulosis without diverticulitis but was otherwise unremarkable.  He reports that he has had some difficulty urinating today but no dysuria or hematuria.  He denies any fever, chills, chest pain, shortness of breath, dysuria, diarrhea, constipation, hematochezia or dark tarry stools.  He does admit to smoking cigarettes and marijuana although denies alcohol or other drug use.  His pain is currently rated 6/10, constant, with radiation to the groin, nothing worsens or improves the pain.  He was prescribed muscle relaxers although he did not fill the prescription.  He has been taking Motrin over-the-counter as needed for the pain without relief.  This episode is similar to the episode he had in August when he was seen in the emergency department.    The history is provided by the patient.   Illness    The current episode started 3 to 5 days ago. The onset was gradual. The problem occurs continuously. The problem is severe. Nothing relieves the symptoms. Nothing aggravates the symptoms. Associated symptoms include abdominal pain (Left lower quadrant). Pertinent negatives include no fever, no constipation, no diarrhea, no nausea, no vomiting, no congestion, no headaches, no sore throat, no neck pain, no cough and no rash.        Review of Systems   Constitutional: Negative for activity change, appetite change, chills, diaphoresis, fatigue and fever.   HENT: Negative for congestion and sore throat.    Eyes: Negative for visual  disturbance.   Respiratory: Negative for cough and shortness of breath.    Cardiovascular: Negative for chest pain, palpitations and leg swelling.   Gastrointestinal: Positive for abdominal pain (Left lower quadrant). Negative for abdominal distention, anal bleeding, blood in stool, constipation, diarrhea, nausea and vomiting.   Endocrine: Negative for polyuria.   Genitourinary: Positive for difficulty urinating and flank pain (Left). Negative for decreased urine volume, dysuria, frequency, hematuria, penile pain, penile swelling, scrotal swelling, testicular pain and urgency.   Musculoskeletal: Negative for arthralgias, back pain, gait problem, joint swelling, myalgias, neck pain and neck stiffness.   Skin: Negative for color change, pallor, rash and wound.   Allergic/Immunologic: Negative for immunocompromised state.   Neurological: Negative for dizziness, seizures, syncope, weakness, light-headedness and headaches.   Hematological: Negative for adenopathy. Does not bruise/bleed easily.   Psychiatric/Behavioral: Negative.         Physical Exam   Constitutional: He is oriented to person, place, and time. He appears well-developed and well-nourished. No distress.   HENT:   Head: Normocephalic and atraumatic.   Right Ear: External ear normal.   Left Ear: External ear normal.   Nose: Nose normal.   Mouth/Throat: Oropharynx is clear and moist. No oropharyngeal exudate.   Eyes: Pupils are equal, round, and reactive to light. Conjunctivae and EOM are normal. Right eye exhibits no discharge. Left eye exhibits no discharge. No scleral icterus.   Neck: Normal range of motion. Neck supple. No JVD present. No tracheal deviation present. No thyromegaly present.   Cardiovascular: Normal rate, regular  rhythm, normal heart sounds and intact distal pulses. Exam reveals no gallop and no friction rub.   No murmur heard.  Pulmonary/Chest: Effort normal and breath sounds normal. No stridor. No respiratory distress. He has no wheezes.  He has no rales. He exhibits no tenderness.   Abdominal: Soft. Bowel sounds are normal. He exhibits no distension, no pulsatile midline mass and no mass. There is no hepatosplenomegaly. There is tenderness in the left lower quadrant. There is no rigidity, no rebound, no guarding, no CVA tenderness, no tenderness at McBurney's point and negative Murphy's sign. Hernia confirmed negative in the right inguinal area and confirmed negative in the left inguinal area.   Genitourinary: Testes normal and penis normal. No phimosis, paraphimosis, hypospadias, penile erythema or penile tenderness. No discharge found.   Musculoskeletal: Normal range of motion. He exhibits no edema, tenderness or deformity.   Lymphadenopathy:     He has no cervical adenopathy. No inguinal adenopathy noted on the right or left side.   Neurological: He is alert and oriented to person, place, and time.   Skin: Skin is warm and dry. No rash noted. He is not diaphoretic. No erythema. No pallor.   Psychiatric: He has a normal mood and affect. His behavior is normal. Judgment and thought content normal.   Nursing note and vitals reviewed.       Procedures     MDM   Patient presents to the ED for left flank pain, left lower quadrant abdominal pain. Differential diagnoses included but not limited to diverticulitis, diverticulosis, UTI, kidney stone, musculoskeletal strain, other process.  There is no rash so shingles is not considered at this time.  Workup in the ED revealed findings on CT scan concerning for early diverticulitis. Patient was given analgesics, antiemetics, first dose of oral antibiotics for treatment of colitis for their symptoms with moderate improvement. Patient continues to be non-toxic on re-evaluation. Findings were discussed with the patient and reasons to immediately return to the ED were articulated to them. They will follow-up with their PMD and general surgery.    ED Course as of Apr 12 1430   Wed Apr 12, 2018   1420 Patient  reassessed.  His pain is tolerable.  He was updated on labs and diagnostic imaging results.  He is agreeable to being discharged home with treatment for diverticulitis and general surgery follow-up.    [LS]      ED Course User Index  [LS] Lula Olszewski, DO        ED Course as of Apr 12 1430   Wed Apr 12, 2018   1420 Patient reassessed.  His pain is tolerable.  He was updated on labs and diagnostic imaging results.  He is agreeable to being discharged home with treatment for diverticulitis and general surgery follow-up.    [LS]      ED Course User Index  [LS] Lula Olszewski, DO       --------------------------------------------- PAST HISTORY ---------------------------------------------  Past Medical History:  has a past medical history of GERD (gastroesophageal reflux disease).    Past Surgical History:  has no past surgical history on file.    Social History:  reports that he has been smoking. He has never used smokeless tobacco. He reports that he has current or past drug history. Drug: Marijuana. He reports that he does not drink alcohol.    Family History: family history is not on file.     The patient's home medications have been reviewed.  Allergies: Patient has no known allergies.    -------------------------------------------------- RESULTS -------------------------------------------------  Labs:  Results for orders placed or performed during the hospital encounter of 04/12/18   CBC Auto Differential   Result Value Ref Range    WBC 8.8 4.5 - 11.5 E9/L    RBC 5.16 3.80 - 5.80 E12/L    Hemoglobin 15.7 12.5 - 16.5 g/dL    Hematocrit 16.1 09.6 - 54.0 %    MCV 93.0 80.0 - 99.9 fL    MCH 30.4 26.0 - 35.0 pg    MCHC 32.7 32.0 - 34.5 %    RDW 13.2 11.5 - 15.0 fL    Platelets 291 130 - 450 E9/L    MPV 11.6 7.0 - 12.0 fL    Neutrophils % 54.6 43.0 - 80.0 %    Immature Granulocytes % 0.2 0.0 - 5.0 %    Lymphocytes % 32.4 20.0 - 42.0 %    Monocytes % 9.5 2.0 - 12.0 %    Eosinophils % 2.6 0.0 - 6.0 %    Basophils % 0.7  0.0 - 2.0 %    Neutrophils Absolute 4.81 1.80 - 7.30 E9/L    Immature Granulocytes # 0.02 E9/L    Lymphocytes Absolute 2.86 1.50 - 4.00 E9/L    Monocytes Absolute 0.84 0.10 - 0.95 E9/L    Eosinophils Absolute 0.23 0.05 - 0.50 E9/L    Basophils Absolute 0.06 0.00 - 0.20 E9/L   Basic Metabolic Panel   Result Value Ref Range    Sodium 141 132 - 146 mmol/L    Potassium 3.7 3.5 - 5.0 mmol/L    Chloride 102 98 - 107 mmol/L    CO2 27 22 - 29 mmol/L    Anion Gap 12 7 - 16 mmol/L    Glucose 90 74 - 99 mg/dL    BUN 10 6 - 20 mg/dL    CREATININE 0.8 0.7 - 1.2 mg/dL    GFR Non-African American >60 >=60 mL/min/1.73    GFR African American >60     Calcium 9.0 8.6 - 10.2 mg/dL   Lactic Acid, Plasma   Result Value Ref Range    Lactic Acid 1.2 0.5 - 2.2 mmol/L   Urinalysis with Microscopic   Result Value Ref Range    Color, UA Yellow Straw/Yellow    Clarity, UA Clear Clear    Glucose, Ur Negative Negative mg/dL    Bilirubin Urine Negative Negative    Ketones, Urine Negative Negative mg/dL    Specific Gravity, UA 1.020 1.005 - 1.030    Blood, Urine Negative Negative    pH, UA 6.5 5.0 - 9.0    Protein, UA Negative Negative mg/dL    Urobilinogen, Urine 1.0 <2.0 E.U./dL    Nitrite, Urine Negative Negative    Leukocyte Esterase, Urine Negative Negative    WBC, UA NONE 0 - 5 /HPF    RBC, UA NONE 0 - 2 /HPF    Bacteria, UA NONE /HPF       Radiology:  CT ABDOMEN PELVIS W IV CONTRAST Additional Contrast? None   Final Result   There are multiple diverticula seen . There is questionable very mild   inflammatory changes in the sigmoid colon.   Findings compatible with fatty infiltration of the liver   Prominent enlarged bilateral seminal vesicles                                     -------------------------  NURSING NOTES AND VITALS REVIEWED ---------------------------  Date / Time Roomed:  04/12/2018 12:11 PM  ED Bed Assignment:  02/02    The nursing notes within the ED encounter and vital signs as below have been reviewed.   BP 114/69   Pulse  80   Temp 97.9 F (36.6 C) (Oral)   Resp 14   Ht 5\' 4"  (1.626 m)   Wt 200 lb (90.7 kg)   SpO2 96%   BMI 34.33 kg/m   Oxygen Saturation Interpretation: Normal      ------------------------------------------ PROGRESS NOTES ------------------------------------------  2:31 PM  I have spoken with the patient and discussed today's results, in addition to providing specific details for the plan of care and counseling regarding the diagnosis and prognosis.  Their questions are answered at this time and they are agreeable with the plan. I discussed at length with them reasons for immediate return here for re evaluation. They will followup with their primary care physician and general surgeon by calling their office tomorrow.      --------------------------------- ADDITIONAL PROVIDER NOTES ---------------------------------  At this time the patient is without objective evidence of an acute process requiring hospitalization or inpatient management.  They have remained hemodynamically stable throughout their entire ED visit and are stable for discharge with outpatient follow-up.     The plan has been discussed in detail and they are aware of the specific conditions for emergent return, as well as the importance of follow-up.      New Prescriptions    CEFDINIR (OMNICEF) 300 MG CAPSULE    Take 1 capsule by mouth 2 times daily for 7 days    HYDROCODONE-ACETAMINOPHEN (NORCO) 5-325 MG PER TABLET    Take 1 tablet by mouth every 6 hours as needed for Pain for up to 3 days.    METRONIDAZOLE (FLAGYL) 500 MG TABLET    Take 1 tablet by mouth 3 times daily for 7 days       Diagnosis:  1. Diverticulitis of colon        Disposition:  Patient's disposition: Discharge to home  Patient's condition is stable.            Lula Olszewski, DO  Resident  04/12/18 2223

## 2019-12-31 ENCOUNTER — Inpatient Hospital Stay
Admit: 2019-12-31 | Discharge: 2019-12-31 | Disposition: A | Discharge status: Home / Self Care | Payer: PRIVATE HEALTH INSURANCE | Attending: PA

## 2019-12-31 DIAGNOSIS — M5416 Radiculopathy, lumbar region: Secondary | ICD-10-CM

## 2019-12-31 LAB — ED INFORMATION EXCHANGE

## 2019-12-31 MED ORDER — orphenadrine (NORFLEX) injection 60 mg
30 | Freq: Once | INTRAMUSCULAR | Status: AC
Start: 2019-12-31 — End: 2019-12-31
  Administered 2019-12-31: 18:00:00 30 mg via INTRAMUSCULAR

## 2019-12-31 MED ORDER — ketorolac (TORADOL) 15 mg/mL injection 30 mg
15 | Freq: Once | INTRAMUSCULAR | Status: AC
Start: 2019-12-31 — End: 2019-12-31
  Administered 2019-12-31: 18:00:00 15 mg via INTRAMUSCULAR

## 2019-12-31 MED ORDER — ibuprofen (ADVIL) 600 MG tablet
600 | ORAL_TABLET | Freq: Three times a day (TID) | ORAL | 0 refills | Status: AC | PRN
Start: 2019-12-31 — End: ?

## 2019-12-31 MED ORDER — cyclobenzaprine (FLEXERIL) 10 mg tablet
10 | ORAL_TABLET | Freq: Two times a day (BID) | ORAL | 0 refills | 30.00000 days | Status: AC | PRN
Start: 2019-12-31 — End: 2020-01-05

## 2019-12-31 MED FILL — ORPHENADRINE CITRATE 30 MG/ML INJECTION SOLUTION: 30 mg/mL | INTRAMUSCULAR | Qty: 2

## 2019-12-31 MED FILL — KETOROLAC 15 MG/ML INJECTION SOLUTION: 15 mg/mL | INTRAMUSCULAR | Qty: 30

## 2019-12-31 NOTE — Discharge Instructions (Signed)
Patient Education     Your pain is consistent with lumbosacral radiculopathy.  I recommend you continue with ibuprofen every 8 hours for pain control.  Recommend rest ice and avoid any activities that worsen your symptoms.  Return to ER immediately for weakness in your lower extremities, loss of bowel or bladder control, feeling like your legs are going to give out.  You have been referred to [facility] neurosurgery.  I recommend you give them a call to schedule an appointment.  Lumbosacral Radiculopathy  Lumbosacral radiculopathy is a condition that involves the spinal nerves and nerve roots in the low back and bottom of the spine. The condition develops when these nerves and nerve roots move out of place or become inflamed and cause symptoms.  What are the causes?  This condition may be caused by:  ? Pressure from a disk that bulges out of place (herniated disk). A disk is a plate of soft cartilage that separates bones in the spine.  ? Disk changes that occur with age (disk degeneration).  ? A narrowing of the bones of the lower back (spinal stenosis).  ? A tumor.  ? An infection.  ? An injury that places sudden pressure on the disks that cushion the bones of your lower spine.  What increases the risk?  You are more likely to develop this condition if:  ? You are a male who is 63-91 years old.  ? You are a male who is 6-37 years old.  ? You use improper technique when lifting things.  ? You are overweight or live a sedentary lifestyle.  ? Your work requires frequent lifting.  ? You smoke.  ? You do repetitive activities that strain the spine.  What are the signs or symptoms?  Symptoms of this condition include:  ? Pain that goes down from your back into your legs (sciatica), usually on one side of the body. This is the most common symptom. The pain may be worse with sitting, coughing, or sneezing.  ? Pain and numbness in your legs.  ? Muscle weakness.  ? Tingling.  ? Loss of bladder control or bowel  control.  How is this diagnosed?  This condition may be diagnosed based on:  ? Your symptoms and medical history.  ? A physical exam.  If the pain is lasting, you may have tests, such as:  ? MRI scan.  ? X-ray.  ? CT scan.  ? A type of X-ray used to examine the spinal canal after injecting a dye into your spine (myelogram).  ? A test to measure how electrical impulses move through a nerve (nerve conduction study).  How is this treated?  Treatment may depend on the cause of the condition and may include:  ? Working with a physical therapist.  ? Taking pain medicine.  ? Applying heat and ice to affected areas.  ? Doing stretches to improve flexibility.  ? Doing exercises to strengthen back muscles.  ? Having chiropractic spinal manipulation.  ? Using transcutaneous electrical nerve stimulation (TENS) therapy.  ? Getting a steroid injection in the spine.  In some cases, no treatment is needed. If the condition is long-lasting (chronic), or if symptoms are severe, treatment may involve surgery or lifestyle changes, such as following a weight-loss plan.  Follow these instructions at home:  Activity  ? Avoid bending and other activities that make the problem worse.  ? Maintain a proper position when standing or sitting:  ? When standing, keep your  upper back and neck straight, with your shoulders pulled back. Avoid slouching.  ? When sitting, keep your back straight and relax your shoulders. Do not round your shoulders or pull them backward.  ? Do not sit or stand in one place for long periods of time.  ? Take brief periods of rest throughout the day. This will reduce your pain. It is usually better to rest by lying down or standing, not sitting.  ? When you are resting for longer periods, mix in some mild activity or stretching between periods of rest. This will help to prevent stiffness and pain.  ? Get regular exercise. Ask your health care provider what activities are safe for you. If you were shown how to do any  exercises or stretches, do them as directed by your health care provider.  ? Do not lift anything that is heavier than 10 lb (4.5 kg) or the limit that you are told by your health care provider. Always use proper lifting technique, which includes:  ? Bending your knees.  ? Keeping the load close to your body.  ? Avoiding twisting.  Managing pain  ? If directed, put ice on the affected area:  ? Put ice in a plastic bag.  ? Place a towel between your skin and the bag.  ? Leave the ice on for 20 minutes, 2-3 times a day.  ? If directed, apply heat to the affected area as often as told by your health care provider. Use the heat source that your health care provider recommends, such as a moist heat pack or a heating pad.  ? Place a towel between your skin and the heat source.  ? Leave the heat on for 20-30 minutes.  ? Remove the heat if your skin turns bright red. This is especially important if you are unable to feel pain, heat, or cold. You may have a greater risk of getting burned.  ? Take over-the-counter and prescription medicines only as told by your health care provider.  General instructions  ? Sleep on a firm mattress in a comfortable position. Try lying on your side with your knees slightly bent. If you lie on your back, put a pillow under your knees.  ? Do not drive or use heavy machinery while taking prescription pain medicine.  ? If your health care provider prescribed a diet or exercise program, follow it as directed.  ? Keep all follow-up visits as told by your health care provider. This is important.  Contact a health care provider if:  ? Your pain does not improve over time, even when taking pain medicines.  Get help right away if:  ? You develop severe pain.  ? Your pain suddenly gets worse.  ? You develop increasing weakness in your legs.  ? You lose the ability to control your bladder or bowel.  ? You have difficulty walking or balancing.  ? You have a fever.  Summary  ? Lumbosacral radiculopathy is a  condition that occurs when the spinal nerves and nerve roots in the lower part of the spine move out of place or become inflamed and cause symptoms.  ? Symptoms include pain, numbness, and tingling that go down from your back into your legs (sciatica), muscle weakness, and loss of bladder control or bowel control.  ? If directed, apply ice or heat to the affected area as told by your health care provider.  ? Follow instructions about activity, rest, and proper lifting technique.  This  information is not intended to replace advice given to you by your health care provider. Make sure you discuss any questions you have with your health care provider.  Document Released: 07/05/2005 Document Revised: 06/23/2017 Document Reviewed: 06/23/2017  Elsevier Interactive Patient Education ? 2019 [employer]

## 2019-12-31 NOTE — ED Triage Notes (Signed)
Pt arrives for left-sided back pain radiating down his leg after helping a friend move yesterday and lifting heavy objects.   Pt took tylenol and ibuprofen without relief. Pt denies numbness/tingling in extremities.   Pt A&O x4, breathing even and unlabored and PWD.

## 2019-12-31 NOTE — ED Provider Notes (Signed)
[facility] EMERGENCY DEPARTMENT ENCOUNTER    History and Physical     Name: Rashaan Wyles  DOB: 27-Dec-1980 39 y.o.  MRN: [id number]  CSN: [id number]    HISTORY:     CHIEF COMPLAINT    Chief Complaint   Patient presents with   . Back Pain       HPI    Ranger Petrich is a 39 y.o. male who presents for evaluation of left-sided lower back pain that started yesterday.  Patient tells me he was helping a buddy move boxes he bent down and as he was standing up felt a pop in his lower back.  He has had some pain that radiates down his left leg. He denies saddle paresthesia, extremity weakness, fever, chills, abdominal pain, IV drug abuse, changes in bowel or bladder control.  He has tried acetaminophen and ibuprofen for some pain control.  This is helped minimally.        PAST MEDICAL HISTORY    No past medical history on file.    SURGICAL HISTORY    No past surgical history on file.    CURRENT MEDICATIONS    Home Medications     Med List Status: Initial Review Done Set By: Deatra Robinson, RN at 12/31/2019 10:06 AM        No medications reported.          ALLERGIES    Patient has no known allergies.    FAMILY HISTORY    No family history on file.    SOCIAL HISTORY    Social History     Tobacco Use   . Smoking status: Not on file   Substance Use Topics   . Alcohol use: Not on file   . Drug use: Not on file     No existing history information found.    REVIEW OF SYSTEMS:   Constitutional:  Denies fever, chills, weakness     Respiratory:  Denies cough or shortness of breath   Cardiovascular:  Denies chest pain, palpitations  GI:  Denies abdominal pain, nausea, vomiting, or diarrhea   GU: Denies dysuria. Denies hematuria.  Musculoskeletal:  + left lower back pain   Skin:  Denies rash   Neurologic:  Denies headache, focal weakness or sensory changes.       Remainder of full 10 point review of systems is either negative or not pertinent.      PHYSICAL EXAM:   VITAL SIGNS:    Initial Vitals [12/31/19 0941]   BP 118/78      Pulse 96   Resp 18   Temp 36.2 ?C (97.2 ?F)   SpO2 98 %     Constitutional:  No acute distress, Non-toxic appearance.     Respiratory:  Breathing  unlabored.  Cardiovascular:   Normal rhythm, No murmurs  GI:  Bowel sounds normal, Soft, No tenderness, No masses, No pulsatile masses.   GU: No CVA tenderness.   Musculoskeletal:  + pain to left lower paraspinal area. No stepoffs or midline tenderness to palpation  Integument:  Warm, Dry, No erythema, No rash.      Neurologic:  Alert & oriented x 3. Stands on heels, stands on toes, quadriceps strength within normal limits, ambulates with steady gait. Patellar  reflexes are 2+ bilaterally and equal , sensation intact in lower extremities.Normal tandem gait, stance, stride and transfer Cerebellar: Normal finger-to-nose and heel-shin test         DATA:  LABS    No results found for this visit on 12/31/19 (from the past 24 hour(s)).    RADIOLOGY/PROCEDURES    none    PLAN:     ED COURSE & MEDICAL DECISION MAKING    Pertinent Labs & Imaging studies reviewed. (See chart for details)     Differential diagnosis includes but is not limited to:  Muscle strain  Vertebral Fracture less likely no significant trauma  Disc herniation   Radiculopathy  Spinal Stenosis less likely pain is not relieved by flexion and sitting  Degenerative Disc Disease  Arthritis  Cancer less likely no hx of cancer, no unexplained weight loss, pain has not persisted for more than 4-6 weeks   Cauda Equina less likely no new weakness, no saddle anesthesia, no B/B incontinence   Spinal Abscess less likely no hx of IVDA, no fever, no recent surgery or recent bacterial infection, no immunosuppression      Patient presents with acute left low back pain that started yesterday when moving boxes for a friend.  Patient denies any falls or trauma to the area.  He has no midline tenderness.  Making fracture less likely.    His exam is consistent with L4-L5 radiculopathy as he has a positive straight leg on the  left side.  He denies any red flag symptoms making cord compression less likely.  I referred him to neurosurgery for further evaluation and will send him out on muscle relaxers and ibuprofen for conservative management of his pain.        RN Notes reviwed    Medications   orphenadrine (NORFLEX) injection 60 mg (60 mg Intramuscular Given 12/31/19 1046)   ketorolac (TORADOL) 15 mg/mL injection 30 mg (30 mg Intramuscular Given 12/31/19 1046)         I discussed with the patient the diagnosis, treatment plan, indications for return to the emergency department, and for expected follow-up. The patient verbalized an understanding. The patient is asked if there are any questions or concerns. We discuss the case, until all issues are addressed to the patient's satisfaction.  FINAL IMPRESSION    1.  Low back pain, radiculopathy         This document was created with the assistance of voice-to-text technology. Effort has been made to minimize transcription errors. Please allow for homonyms and other similar transcription errors.     Scharlene Gloss [place], [place]  12/31/19 1736

## 2019-12-31 NOTE — Discharge Planning (AHS/AVS) (Signed)
Phone call to patient regarding referral to [facility]) being declined due to insurance. Provided options for patient to establish with local provider at [facility] or [facility] and informed that either would assist him in changing his medicaid CCO from [facility] to a local CCO like [facility] or [employer]. Once insurance is changed, patient would be eligible to follow up with [facility].

## 2020-01-07 LAB — CBC WITH AUTO DIFFERENTIAL
Basophils %: 1 % (ref 0–2)
Basophils, Absolute: 0.1 10*3/??L (ref 0.0–0.2)
Eosinophils %: 2 % (ref 0–7)
Eosinophils, Absolute: 0.2 10*3/??L (ref 0.0–0.7)
HCT: 47.7 % (ref 42.0–54.0)
Hemoglobin: 16.1 g/dL (ref 12.0–18.0)
Lymphocytes %: 30 % (ref 25–45)
Lymphocytes, Absolute: 3.1 10*3/??L (ref 1.1–4.3)
MCH: 30.3 pg (ref 27.0–34.0)
MCHC: 33.7 g/dL (ref 32.0–36.0)
MCV: 89.9 fL (ref 81.0–99.0)
MPV: 10.5 fL — ABNORMAL HIGH (ref 7.4–10.4)
Monocytes %: 7 % (ref 0–12)
Monocytes, Absolute: 0.7 10*3/??L (ref 0.0–1.2)
Neutrophils %: 61 % (ref 35–70)
Neutrophils, Absolute: 6.4 10*3/??L (ref 1.6–7.3)
Platelet Count: 255 10*3/??L (ref 150–400)
RBC: 5.31 10*6/??L (ref 4.70–6.10)
RDW: 14.5 % (ref 11.5–14.5)
WBC: 10.5 10*3/??L (ref 4.8–10.8)

## 2020-01-07 LAB — COMPREHENSIVE METABOLIC PANEL
ALT - Alanine Aminotransferase: 19 IU/L (ref 7–52)
AST - Aspartate Aminotransferase: 15 IU/L (ref 10–50)
Albumin/Globulin Ratio: 1.9 (ref >0.9)
Albumin: 4.3 g/dL (ref 3.5–5.0)
Alkaline Phosphatase: 166 IU/L — ABNORMAL HIGH (ref 34–104)
Anion Gap: 8 mmol/L (ref 4–13)
BUN: 12 mg/dL (ref 6–23)
Bilirubin Total: 0.8 mg/dL (ref 0.3–1.2)
CO2 - Carbon Dioxide: 29 mmol/L (ref 21–31)
Calcium: 9.1 mg/dL (ref 8.6–10.3)
Chloride: 105 mmol/L (ref 98–111)
Creatinine: 0.84 mg/dL (ref 0.65–1.30)
GFR Estimate Male: >60 mL/min/{1.73_m2} (ref >=60)
Globulin: 2.3 g/dL (ref 2.2–3.7)
Glucose: 86 mg/dL (ref 80–99)
Potassium: 3.9 mmol/L (ref 3.5–5.1)
Protein Total: 6.6 g/dL (ref 6.0–8.0)
Sodium: 142 mmol/L (ref 135–143)

## 2020-01-07 LAB — GLYCO-HEMOGLOBIN A1C
Estimated Average Glucose: 114 mg/dL
Glycohemoglobin (A1c): 5.6 % (ref 4.3–6.1)

## 2020-01-07 LAB — HIV ANTIGEN & ANTIBODY SCREEN: HIV1/HIV2 Antibody/Antigen Screen: NONREACTIVE

## 2020-01-07 LAB — CORONARY RISK LIPID PANEL
Cholesterol, HDL: 35 mg/dL — ABNORMAL LOW (ref >=40)
Cholesterol: 228 mg/dL — ABNORMAL HIGH (ref <200)
LDL Calculated: 176 mg/dL — ABNORMAL HIGH (ref <100)
Triglyceride: 87 mg/dL (ref 30–149)

## 2020-01-07 LAB — 25-OH HYDROXY VITAMIN D, CALCIFEROL, TOTAL OF D2 & D3: Vitamin D 25-OH Total: 24.9 ng/mL — ABNORMAL LOW (ref 30.0–100.0)

## 2020-01-07 LAB — TSH: TSH - Thyroid Stimulating Hormone: <0.01 ??IU/mL — ABNORMAL LOW (ref 0.45–5.33)

## 2020-01-07 LAB — HEPATITIS C ANTIBODY W/ REFLEX HCV RNA PCR (ASA): Hepatitis C Antibody Interpretation: NEGATIVE

## 2020-01-14 LAB — TSH: TSH - Thyroid Stimulating Hormone: <0.01 ??IU/mL — ABNORMAL LOW (ref 0.45–5.33)

## 2020-01-14 LAB — T3, FREE: T3, Free: 4 pg/mL — ABNORMAL HIGH (ref 2.5–3.9)

## 2020-01-14 LAB — THYROPEROXIDASE ANTIBODY: THYROPEROXIDASE ANTIBODY: 221.9 IU/mL — ABNORMAL HIGH (ref <9.00)

## 2020-01-14 LAB — T4, FREE: T4, Free: 0.9 ng/dL (ref 0.6–1.2)

## 2020-03-10 LAB — THYROPEROXIDASE ANTIBODY: THYROPEROXIDASE ANTIBODY: 294.7 IU/mL — ABNORMAL HIGH (ref <9.00)

## 2020-03-10 LAB — TOTAL T3: T3, Total: 126 ng/dL (ref 80–200)

## 2020-03-10 LAB — THYROID STIMULATING IMMUNOGLOBULIN: Thyroid stimulating immunoglobulin: 2.5 TSI index — ABNORMAL HIGH (ref <=1.3)

## 2020-03-10 LAB — THYROGLOBULIN ANTIBODY: Thyroglobulin Antibody: 591.1 IU/mL — ABNORMAL HIGH (ref <4.0)

## 2020-05-26 ENCOUNTER — Inpatient Hospital Stay: Admit: 2020-05-26 | Payer: PRIVATE HEALTH INSURANCE | Attending: "Endocrinology | Primary: Medical

## 2020-05-26 DIAGNOSIS — E0590 Thyrotoxicosis, unspecified without thyrotoxic crisis or storm: Secondary | ICD-10-CM

## 2020-05-26 MED ORDER — SODIUM IODIDE (IODINE-123) 207 micro curie
Freq: Once | Status: AC
Start: 2020-05-26 — End: 2020-05-26
  Administered 2020-05-26: 17:00:00 via ORAL

## 2020-05-27 ENCOUNTER — Inpatient Hospital Stay: Admit: 2020-05-27 | Payer: PRIVATE HEALTH INSURANCE | Attending: "Endocrinology | Primary: Medical

## 2020-09-16 NOTE — ED Provider Notes (Signed)
[facility]    Johnny Perez  Emergency Department Encounter Note    ZOX:[id number]           PCP:No Physician on file    History     CC:   Foot Pain      HPI:   Johnny Perez is a 40 y.o. male who presents to the ED for evaluation of toe pain. The patient states he caught it on the end of a bookshelf yesterday evening at approximately 10 PM. He did place ice and took Tylenol and Motrin, and states that this morning it was worse and he went to try and go to work but was unable to participate so came here to the emergency department to be evaluated. He denies a previous injury to it. He states he did ice it last night and it is being iced currently. No previous fractures.    PMH:   He  has a past medical history of ADHD, Back pain, Bipolar 1 disorder (HCC), Diverticulitis, Manic depression (HCC), and PTSD (post-traumatic stress disorder).    PSH:   He  has no past surgical history on file.     Medications:   PTA Home Medications   Medication Sig   . gabapentin (NEURONTIN) 100 mg capsule Take 1 capsule by mouth nightly. (Patient not taking: Reported on 09/05/2019.)   . hydrOXYzine hydrochloride (ATARAX) 25 mg tablet Take 1 tablet by mouth every 6 hours as needed for Itching. (Patient not taking: Reported on 09/05/2019.)   . ondansetron (ZOFRAN ODT) 4 mg disintegrating tablet Take 1 tablet by mouth every 8 hours as needed for Nausea. (Patient not taking: Reported on 04/19/2019.)   . PARoxetine (PAXIL) 40 MG tablet Take 1 tablet by mouth every morning. (Patient not taking: Reported on 04/19/2019.)   . pseudoePHEDrine (SUDAFED) 60 MG tablet Take 1 tablet by mouth every 6 hours as needed for Congestion.   . sucralfate (CARAFATE) 1 g tablet Take 1 tablet by mouth 2 times daily. (Patient not taking: Reported on 09/05/2019.)       Allergies:   He has No Known Allergies.    Social History:    He  reports that he has been smoking. He has been smoking about 0.25 packs per day. He has never used  smokeless tobacco. He  reports no history of alcohol use. He  reports current drug use. Drug: Marijuana.       Review of Systems:   As in HPI otherwise complete review of systems negative except for those listed above.      Physical Exam   VITAL SIGNS:   Triage Vitals    Date and Time Temp Pulse Resp BP SpO2 Weight User   09/16/20 1013 36.7 ?C (98 ?F) 92 16 153/91 95 % 86.2 kg (190 lb) JLM             Constitutional: No acute distress. Non-toxic appearance.    HEENT: Normocephalic. Atraumatic. Oropharynx is clear with moist mucous membranes. Pupils equal, extra occular movement intact.     Neck:  Supple and nontender without adenopathy.    Chest: Lungs clear to auscultation without wheezes, rales or rhonchi.    Cardiovascular: Regular rate and rhythm without murmurs, rubs or gallops.    Abdomen: Bowel sounds normal, Soft, No tenderness, No masses, No pulsatile masses.        Skin: Warm and dry. No erythema. No rash.         Extremities: No cyanosis,  clubbing or left 5th toe ecchymosis, and edema, pulses intact,  Sensation intact    Neurologic: Alert & oriented, Normal motor function, no deficits noted.    ED Course and Medical Decision Making:    Johnny Perez presented to the ED for evaluation, and he was triaged to room the acute side of the emergency department.  Medical records and nursing notes reviewed.  Based on his history and exam the differential diagnosis includes toe fracture, forefoot fracture    Labs Reviewed - No data to display     Recent Results (from the past 360 hour(s))   XR Foot Left 3 + Vw    Narrative       FOOT MIN 3 VIEW LEFT 09/16/2020 10:24 AM    PROVIDED CLINICAL INDICATIONS: FOOT PAIN;     ADDITIONAL CLINICAL HISTORY:  None.    COMPARISON:  None.    TECHNIQUE:  3 radiographic views of the LEFT foot.    FINDINGS:  No fracture. No subluxation. The Lisfranc joint is intact. No significant soft   tissue swelling. No destructive osseous lesion. The joint spaces are preserved.   No  erosions.      Impression    IMPRESSION:  No pathology identified.    Report Electronically Signed by:  Ramiro Harvest, MD  Report Electronically Signed on:  09/16/2020 11:11       mdm  She has no evidence of fracture or dislocation of the foot.  I will place him in an Ace wrap and postop shoe, advise weight-bear as tolerated.  Also send a prescription for 600 mg ibuprofens to his pharmacy.  CLINICAL IMPRESSION:  1. Contusion of left foot, initial encounter          PLAN:  Discharge                             Orlean Bradford, DO  09/16/20 1129

## 2021-01-15 NOTE — Progress Notes (Signed)
[facility] Progress Note     Name: Johnny Perez  DOB: 01/24/1981  MRN: [id number]     Identifying Information     Johnny Perez is a 40 year old male with a history of?Bipolar disorder, anxiety, and ADHD?who is here today for follow up of Bipolar Disorder, Anxiety, and ADHD.     Subjective   This visit is provided telephonically/virtually due to patient request regarding Covid19.    Pt reports that there is alot of drama in his life right now. Feels that he is about to give up. Wants to go back to school, however is being put in a position where he needs to choose between work and school. Also expressed that he is about to loss his housing. Overall reports being a mess recently. Is currently trying to get an extension on his leave of absence from school. Overall feels stuck. Can't afford at the moment to loss either one, school or work. Reported that everything started unraveling last week. Discussed Pt working with his CHW for housing resources. Stated that [facility] and [facility] have offered to help him financially with a place howevr he is having difficulty [place] one in [place]. Stated that before all these stressors his mood was stable. Reviewed his medication, he denied any side effects. Reports that Adderall has helped him to focus however it does not last all day. States his mother has noticed a difference in his symptoms since starting Adderall as he is able to focus in a conversation. Denies it affecting his sleep, anxiety, or appetite. Denies any issues with irritability. Still sleeping 4-6 hours of solid. Feels his sleep is better.                Objective   Current Medications:   Current Outpatient Medications   Medication Sig Dispense Refill   . dextroamphetamine-amphetamine (ADDERALL) 5 mg tablet Take 1 Tablet by mouth 2 (two) times daily 60 Tablet 0   . diazePAM (VALIUM) 10 mg tablet Take 1 Tablet by mouth once daily 30 Tablet 2   . escitalopram oxalate (LEXAPRO) 5 mg tablet Take 1  Tablet by mouth once daily 30 Tablet 5   . lamoTRIgine (LAMICTAL) 100 mg tablet Take 1 Tablet by mouth once daily With 150mg  tab for a total of 250mg  daily 30 Tablet 5   . lamoTRIgine (LAMICTAL) 150 mg tablet Take 1 Tablet by mouth once daily With 100mg  tab for total of 250mg  daily 30 Tablet 5   . cholecalciferol, vitamin D3, 50 mcg (2,000 unit) capsule Take 1 Capsule by mouth once daily 90 Capsule 3     No current facility-administered medications for this visit.       No Known Allergies    Mental Status Exam  Appearance: Well developed and nourished male who appears stated age. Adequately groomed. Cooperative and appropriate.  Attention: Good, intact  Eye Contact: Good, intact  Speech: Spontaneous speech with normal rate and rhythm. Good articulation and appropriately placed inflections  Attitude toward examiner: Open and cooperative  Behavior and psychomotor activity: No tics, tremors, abnormal movements, or psychomotor agitation/retardation noted  Gait: Ambulates steadily without instability  Mood: Frustrated  Affect: Congruent, full range and appropriate  Thought process: Linear, logical and goal oriented. Intact associations.  Thought content: No overt paranoia, delusions, or abnormal thought patterns  Suicidal thought/feelings: Denies  Homicidal thoughts/feelings: Denies  Perception: Denies auditory and visual hallucinations, denies feelings of depersonalization  Orientation: Oriented to person, place, time and  situation.  Cognition: Recent and remote memory appear intact.  Fund of knowledge/language: Appropriate for age  Reliability of information: Good  Insight: Good, intact  Judgement: Good, intact  Impulsivity: Good    Last 3 Vitals    Flowsheet Row BH/MH Visits from 12/17/2020 in [facility]MH Visits from 11/11/2020 in [facility] BH[facility]MH Visits from 05/09/2020 in [facility]   Temp  -- -- --   Pulse -- -- --   BP -- -- --   Resp -- -- --   Weight 185 lb (83.9 kg)  [Pt reported] 190 lb (86.2 kg) 200 lb (90.7 kg)  [pt reports]             Psychiatric ROS    Depression: Denies sadness, prolonged anhedonia, fatigue, loss of energy, weight change, sleep disturbance, inability to concentrate, indecisiveness, distraction, feelings of worthlessness, burdensomeness, guilt, hopelessness.     Anxiety: See above     MANIA: Denied increased goal-directed energy daily lasting more than one week. Denied disinhibition, being grandiose, distractibility, decreased need for sleep, excessive speech, pressure of speech, and PMA agitation. Denied participation in activities with high potential for painful consequences; unrestrained buying sprees, sexual indiscretions, foolish business investments.      Psychosis: Denies paranoia, hallucinations, delusions, ideas of reference, bizarre behaviors, obsessions, religiosity.     Suicide/violence: Denies suicidal ideation and homicidal ideation. Denied suicide attempt. Denies urges to self harm.       Sleep: See above    PTSD: Denies traumatic event, re-experiencing nightmares, flashbacks, ruminating thoughts, hypervigilance, paranoia, agoraphobia, avoidance and isolation     OCD: Denies obsessive thoughts and compulsions.     ADHD: See above    Substance abuse: Denies current substance abuse      Access to firearms: Denies access to firearms    Psychosocial Stressors: See above    Labs:   Lab Results   Component Value Date    NA 142 01/07/2020    K 3.9 01/07/2020    CHLORIDE 105 01/07/2020    C02 29 01/07/2020    CALCIUM 9.1 01/07/2020    BUN 12 01/07/2020    CREATININE 0.84 01/07/2020    ALBUMIN 4.3 01/07/2020    AGRATIO 1.9 01/07/2020    AST 15 01/07/2020    ALT 19 01/07/2020    ALKPHOS 166 (H) 01/07/2020    BILIRUBIN 0.8 01/07/2020    GLUCOSE 86 01/07/2020     Lab Results   Component Value Date    WBC 10.5 01/07/2020    NEUTROPHILS 6.4 01/07/2020    LYMPHOCYTES 3.1  01/07/2020    RBC 5.31 01/07/2020    HGB 16.1 01/07/2020    HCT 47.7 01/07/2020    MCV 89.9 01/07/2020    MCH 30.3 01/07/2020    MCHC 33.7 01/07/2020    RDW 14.5 01/07/2020    PLATELETS 255 01/07/2020    MONOCYTES 0.7 01/07/2020    EOSINOPHILS 0.2 01/07/2020    BASOPHILS 0.1 01/07/2020     Lab Results   Component Value Date    TRIGLYC 87 01/07/2020    CHOL 228 (H) 01/07/2020    HDL 35 (L) 01/07/2020    LDL 176 (H) 01/07/2020       Lab Results   Component Value Date    HGBA1C 5.6 01/07/2020       GAD 7 Score:    01/14/2021  8:44 AM   Gad-7 Total Marland Kitchen)  17     PHQ-9 Total Score (Auto Calculated) (!) 17  at 01/14/2021  8:44 AM       Assessment   40 year old male with a history of  Bipolar disorder, PTSD, persistent complex bereavement disorder, anxiety, and ADHD combined type who reports struggling with anxiety and frustration due to psychosocial stressors as outlined above. However his ADHD has improved since starting Adderall. He remains motivated for treatment.     Lab results were reviewed with Patient/Caregiver, if applicable. Medications were reviewed. Medication education including potential side effects and risk/benefit were discussed with the Patient/Caregiver. Patient/Caregiver verbalized understanding and compliance. A treatment plan was developed/augmented and agreed upon by all parties. Discussed target symptoms and goals.      Plan   Bipolar disorder: Continue Lamictal 250 mg daily  Bipolar disorder, anxiety, PTSD: Continue Lexapro 5 mg daily  Bipolar disorder, anxiety, PTSD, persistent complex bereavement disorder, continue Valium 10 mg daily prn. Reviewed PDMP.   ADHD: Continue Adderall 5mg  daily   Wrote LOA extension letter  Safety Agreement: Patient instructed to call their therapist, local crisis line, 911, or go to the nearest Emergency Room if in crisis.   Return in about 4 weeks (around 01/14/2021).      No orders of the defined types were placed in this encounter.      30 minutes of medical evaluation  and care delivered.    Oletha Blend, PA-C  12/17/2020  10:02 PM PDT

## 2021-02-14 NOTE — Progress Notes (Signed)
[facility] Progress Note     Name: Johnny Perez  DOB: 28-Sep-1980  MRN: [id number]     Identifying Information     Johnny Perez is a 40 year old male with a history of Bipolar disorder, anxiety, and ADHD who is here today for follow up of Bipolar Disorder, Anxiety, and ADHD.     Subjective   This visit is provided telephonically/virtually due to patient request regarding Covid19.    Patient reported that since last session he returned to school but by day 3 without his ESA he had a full blown panic attack that resulted in him being sent home from his class for the day.  Reports being extremely frustrated that his school is not letting him take his ESA to class and as a result is being forced to take another month off from school which is going to delay his graduation as well as prevent him from taking a class that he really wants to take before graduation.  Reports that his anxiety has been so bad that he cannot even handle being at work either so he had to quit his job.  States that he has a lot of anxiety when he is around other people.  States that he gets feelings that everyone is out to get him especially when he is in social situations.  Reports an increase in his depression recently as well.  Reports having feelings of worthlessness all the time.  Also reports that he has been isolating both because of his depression and his anxiety.  Endorses a lack of motivation, but is doing okay with his ADLs.  States that even though he is doing the things he supposed to do he lacks the desire to get them done.  Endorses anhedonia.  States that he is only calm, at ease, and feels safe when he is with his dog.  Reports that he rarely sleeps getting 0-3 hours of broken sleep per night.  States that he will not sleep at all unless his dog is next to him. Reviewed medications, denied any side effects. Is still having issues getting Valium from the pharmacy as he cannot afford the co-pay.            Objective    Current Medications:   Current Outpatient Medications   Medication Sig Dispense Refill   . dextroamphetamine-amphetamine (ADDERALL) 5 mg tablet Take 1 Tablet by mouth 2 (two) times daily 60 Tablet 0   . diazePAM (VALIUM) 10 mg tablet Take 1 Tablet by mouth once daily 30 Tablet 2   . escitalopram oxalate (LEXAPRO) 5 mg tablet Take 1 Tablet by mouth once daily 30 Tablet 5   . lamoTRIgine (LAMICTAL) 100 mg tablet Take 1 Tablet by mouth once daily With 150mg  tab for a total of 250mg  daily 30 Tablet 5   . lamoTRIgine (LAMICTAL) 150 mg tablet Take 1 Tablet by mouth once daily With 100mg  tab for total of 250mg  daily 30 Tablet 5   . cholecalciferol, vitamin D3, 50 mcg (2,000 unit) capsule Take 1 Capsule by mouth once daily 90 Capsule 3     No current facility-administered medications for this visit.       No Known Allergies    Mental Status Exam  Appearance: Well-developed and nourished male who appears stated age.  Adequate grooming. Cooperative and appropriate.  Attention: Good, intact  Eye Contact: Good, intact  Speech: Spontaneous speech with normal rate and rhythm. Good articulation and appropriately placed inflections  Attitude toward examiner: Open and cooperative  Behavior and psychomotor activity: No tics, tremors, or abnormal movements.  Psychomotor agitation noted  Gait: Ambulates steadily without instability.  Patient noted to pace throughout the entire session  Mood: Irritable  Affect: Congruent  Thought process: Linear, logical and goal oriented. Intact associations.  Thought content: Reports paranoia and delusions. No abnormal thought patterns noted  Suicidal thought/feelings: Denies  Homicidal thoughts/feelings: Denies  Perception: Denies auditory and visual hallucinations, denies feelings of depersonalization  Orientation: Oriented to person, place, time and situation.  Cognition: Recent and remote memory appear intact.  Fund of knowledge/language: Appropriate for age  Reliability of information:  Good  Insight: Good, intact  Judgement: Good, intact  Impulsivity: Good    Last 3 Vitals      Flowsheet Row Telemedicine Visit from 02/11/2021 in [facility] Telemedicine Visit from 01/14/2021 in [facility]MH Visits from 12/17/2020 in [facility]   Temp -- -- --   Pulse -- -- --   BP -- -- --   Resp -- -- --   Weight 180 lb (81.6 kg) 189 lb (85.7 kg) 185 lb (83.9 kg) * [Pt reported]             Psychiatric ROS    Depression: See above     Anxiety: See above     MANIA: Denied increased goal-directed energy daily lasting more than one week. Denied disinhibition, being grandiose, distractibility, decreased need for sleep, excessive speech, pressure of speech, and PMA agitation. Denied participation in activities with high potential for painful consequences; unrestrained buying sprees, sexual indiscretions, foolish business investments.      Psychosis: See above     Suicide/violence: Denies suicidal ideation and homicidal ideation. Denied suicide attempt. Denies urges to self harm.       Sleep: See above    PTSD: Denies traumatic event, re-experiencing nightmares, flashbacks, ruminating thoughts, hypervigilance, paranoia, agoraphobia, avoidance and isolation     OCD: Denies obsessive thoughts and compulsions.     Substance abuse: Denies current substance abuse      Access to firearms: Denies access to firearms    Psychosocial Stressors:  See above        GAD 7 Score:    01/14/2021  8:44 AM   Gad-7 Total (!) 17     PHQ-9 Total Score (Auto Calculated) (!) 17  at 01/14/2021  8:44 AM       Assessment   40 year old male with a history of Bipolar disorder, PTSD, persistent complex bereavement disorder, anxiety, and ADHD who reports struggling with high anxiety, panic attacks, and depression. Also he has been experiencing some paranoia and poor sleep, consistent with a mix episode of his Bipolar Disorder. He expresses motivation for treatment.     Lab  results were reviewed with Patient/Caregiver, if applicable. Medications were reviewed. Medication education including potential side effects and risk/benefit were discussed with the Patient/Caregiver. Patient/Caregiver verbalized understanding and compliance. A treatment plan was developed/augmented and agreed upon by all parties. Discussed target symptoms and goals.      Plan   Bipolar disorder: Continue Lamictal 250 mg daily. Start Vraylar 1.5mg  daily   Bipolar disorder, anxiety, PTSD: Continue Lexapro 5 mg daily  Bipolar disorder, anxiety, PTSD, persistent complex bereavement disorder: Continue Valium 10 mg daily prn. Currently not taking due to financial constraints. Reviewed PDMP.   ADHD: Continue Adderall 5 mg BID  Awaiting to be scheduled for individual therapy  Safety Agreement: Patient instructed to call their therapist, local crisis line, 911, or go to the nearest Emergency Room if in crisis.   Return in about 3 weeks (around 03/04/2021).      No orders of the defined types were placed in this encounter.      35 minutes of medical evaluation and care delivered.    Oletha Blend, PA-C  02/11/2021  9:02 AM PDT          *Some images could not be shown.

## 2021-04-22 NOTE — ED Provider Notes (Signed)
ED ARRIVAL: 04/22/2021 2:41 PM    HPI   Chief Complaint   Patient presents with   . Back Pain     Bent down to pick up case that weighed about 40lbs and hear a pop and then had pain, like a burning sensation now a constant throb. Denies and change in bowel or bladder normal sensation in legs able to walk in declines to sit     40 year old male no pertinent past medical history presents with low back pain.  States that he bent down to pick up a case that weighed approximately 40 pounds and heard a pop.  He now has pain in his lower back radiating into his right buttocks.  Denies any bowel or bladder incontinence.  Denies any numbness of his right leg.  Denies any loss of movement of his right leg.  No other complaints.  Denies any IV drug use.          Allergies  No Known Allergies    Prior to Admission Medications     None          Past Medical History:   Diagnosis Date   . Anxiety    . Depression    . History of esophageal reflux    . History of psychiatric care     bi ploar ADHD   . History of thyroid disease        History reviewed. No pertinent surgical history.    No family history on file.    Social History     Tobacco Use   . Smoking status: Current Every Day Smoker     Packs/day: 0.50     Types: Cigarettes   . Smokeless tobacco: Not on file   Vaping Use   . Vaping Use: Never used   Substance Use Topics   . Alcohol use: Never   . Drug use: Yes     Types: Marijuana         Social History     Substance and Sexual Activity   Drug Use Yes   . Types: Marijuana           Review of Systems   Review of Systems   Constitutional: Negative for chills and fever.   HENT: Negative for hearing loss.    Eyes: Negative for visual disturbance.   Respiratory: Negative for chest tightness and shortness of breath.    Cardiovascular: Negative for chest pain.   Gastrointestinal: Negative for abdominal pain, nausea and vomiting.   Genitourinary: Negative for dysuria and hematuria.   Musculoskeletal: Positive for back pain. Negative  for neck pain.   Skin: Negative for rash.   Neurological: Negative for weakness, numbness and headaches.           Physical Exam   ED Initial Vitals   Temp 04/22/21 1506 97.3 ?F (36.3 ?C)   Heart Rate 04/22/21 1506 93   SPO2 Pulse Rate 04/22/21 1920 79   Resp 04/22/21 1506 24   SpO2 04/22/21 1506 98 %   BP 04/22/21 1506 115/80   Pain Score 04/22/21 1506 Seven   Height 04/22/21 1506 5' 4 (1.626 m)   Weight 04/22/21 1506 189 lb (85.7 kg)            Physical Exam  Constitutional:       General: He is not in acute distress.     Appearance: Normal appearance.   HENT:      Head: Normocephalic and atraumatic.  Nose: Nose normal.      Mouth/Throat:      Mouth: Mucous membranes are moist.   Eyes:      Extraocular Movements: Extraocular movements intact.      Conjunctiva/sclera: Conjunctivae normal.      Pupils: Pupils are equal, round, and reactive to light.   Cardiovascular:      Rate and Rhythm: Normal rate and regular rhythm.      Pulses: Normal pulses.      Heart sounds: Normal heart sounds.   Pulmonary:      Effort: Pulmonary effort is normal.      Breath sounds: Normal breath sounds.   Abdominal:      General: There is no distension.      Palpations: Abdomen is soft.      Tenderness: There is no abdominal tenderness. There is no guarding.   Musculoskeletal:         General: No deformity.      Cervical back: Normal range of motion and neck supple.      Thoracic back: Normal.      Lumbar back: Tenderness present. No deformity. Decreased range of motion (Secondary to pain). Positive right straight leg raise test.   Skin:     General: Skin is warm and dry.   Neurological:      General: No focal deficit present.      Mental Status: He is alert and oriented to person, place, and time.   Psychiatric:         Mood and Affect: Mood normal.       Neurologic Exam     Mental Status   Oriented to person, place, and time.     Cranial Nerves     CN III, IV, VI   Pupils are equal, round, and reactive to light.    Back Exam      Tests   Straight leg raise right: positive                    MDM   ECG Results    None       MDM  Number of Diagnoses or Management Options  Other acute back pain  Diagnosis management comments: Patient presents with low back pain  He is neurologically intact throughout  No red flag signs or symptoms for spinal epidural abscess or cauda equina  X-rays negative for any acute pathology  Pain is controlled  Instructed to follow-up with his primary care doctor for MRI  Return precautions were given  Stable for discharge             ED Course          ED Procedures   Procedures      XR Lumbar Spine AP and Lateral / Spot                               ED Medication Administration from 04/22/2021 1441 to 04/22/2021 2039       Date/Time Order Dose Route Action     04/22/2021 1957 diazepam (VALIUM) tablet 5 mg 5 mg Oral Given     04/22/2021 1956 ketorolac (TORADOL) injection 15 mg 15 mg Intramuscular Given     04/22/2021 1957 oxyCODONE-acetaminophen (PERCOCET) 5-325 mg 1 tablet 1 tablet Oral Given     04/22/2021 1957 oxyCODONE-acetaminophen 5-325 mg #6 PREPACK 1 Package Oral Given  ED Prescriptions     Medication Sig Dispense Start Date End Date Auth. Provider    oxyCODONE-acetaminophen (PERCOCET) 5-325 mg Take one tablet by mouth every 6 hours as needed. This medication PREPACK was dispensed to you from the Emergency Department. 6 tablet 04/22/2021  Juliane Poot Holladay, DO    methocarbamol (ROBAXIN) 750 mg tablet Take one tablet (750 mg total) by mouth every 8 hours as needed (muscle spasms). 21 tablet 04/22/2021  Juliane Poot Holladay, DO    ibuprofen (ADVIL,MOTRIN) 600 mg tablet Take one tablet (600 mg total) by mouth every 6 hours as needed for Pain. 30 tablet 04/22/2021  Juliane Poot Holladay, DO    oxyCODONE-acetaminophen (PERCOCET) 5-325 mg Take one tablet by mouth every 4 hours as needed. 10 tablet 04/22/2021  Massie Bougie, DO            Clinical Impressions     Diagnosis Comment    Other acute  back pain             Follow-up Information    None               Juliane Poot [facility], DO  Electronically Signed on:  04/22/21 2039

## 2021-06-16 NOTE — Progress Notes (Signed)
[facility] Behavioral [facility] Progress Note     Name: Johnny Perez  DOB: 05-27-1981  MRN: [id number]     Identifying Information     Johnny Perez is a 40 year old male with a history of Bipolar disorder, anxiety, and ADHD who is here today for follow up of Bipolar Disorder, Sleep, Anxiety, and ADHD.     Subjective   This visit is provided virtually due to patient request regarding Covid-19.    Patient reported that the increase in Lexapro helped with his anxiety in some ways but not in others.  Reports that times he is extremely overwhelmed.  Has issues staying on task.  Stated that he increase his Adderall which seemed to help and wanted to know if he could take 10 mg twice a day.  Reported that the pharmacy recently gave him Valium and it was covered by insurance.  Reports struggling with panic attacks that come on all of a sudden but not too often.  States that his panic attacks mostly occur at work.  Stated that he got a new job since last session.  Reports that his sleep is still poor but better with the increased Adderall.  Endorses struggling with irritability at times.  States that he struggles with setting boundaries and putting himself first without becoming aggressive.  Encouraged to use assertive communication style and to discuss this in therapy.  Reports that he was proud of himself as he was able to set boundaries with his mother tends to be very negative toward him.  Reports that he has been working with his new therapist and likes so reviewed patient psychiatric medications, he denied any side effects.       Objective   Current Medications:   Current Outpatient Medications   Medication Sig Dispense Refill   . cariprazine (VRAYLAR) 4.5 mg cap Take 1 Capsule by mouth once daily 30 Capsule 2   . dextroamphetamine-amphetamine (ADDERALL) 20 mg tablet Take 0.5 Tablets by mouth 2 (two) times daily 30 Tablet 0   . cloNIDine (CATAPRES) 0.1 mg tablet Take 0.5-1 Tablets by mouth 3 (three) times daily as needed  (Anxiety, panic, agitation) 90 Tablet 2   . escitalopram (LEXAPRO) 10 mg tablet Take 1.5 Tablets by mouth once daily 45 Tablet 2   . lamoTRIgine (LAMICTAL) 100 mg tablet Take 1 Tablet by mouth once daily With 150mg  tab for a total of 250mg  daily 30 Tablet 5   . lamoTRIgine (LAMICTAL) 150 mg tablet Take 1 Tablet by mouth once daily With 100mg  tab for total of 250mg  daily 30 Tablet 5   . diazePAM (VALIUM) 10 mg tablet Take 1 Tablet by mouth once daily 30 Tablet 2   . cholecalciferol, vitamin D3, 50 mcg (2,000 unit) capsule Take 1 Capsule by mouth once daily 90 Capsule 3     No current facility-administered medications for this visit.       Mental Status Exam  Appearance: Well-developed and nourished male who appears to days.  Adequate grooming. Cooperative and appropriate.  Attention: Good, intact  Eye Contact: Good, intact  Speech: Spontaneous speech with slight increased rate and rhythm. Good articulation and appropriately placed inflections  Attitude toward examiner: Open and cooperative  Behavior and psychomotor activity: No tics, tremors, abnormal movements, or psychomotor agitation/retardation noted.  Gait: Ambulates steadily without instability  Mood: Neutral     Affect: Anxious  Thought process: Linear, logical and goal oriented. Intact associations.  Thought content: No overt paranoia, delusions, or abnormal  thought patterns  Suicidal thought/feelings: Denies  Homicidal thoughts/feelings: Denies  Perception: Denies auditory and visual hallucinations, denies feelings of depersonalization  Orientation: Oriented to person, place, time and situation.  Cognition: Recent and remote memory appear intact.  Fund of knowledge/language: Appropriate for age  Reliability of information: Good  Insight: Good, intact  Judgement: Good, intact  Impulsivity: Good    Last 3 Vitals      Flowsheet Row Telemedicine Visit from [facility]/04/2021 [facility] Telemedicine Visit from 02/11/2021 in [facility] Telemedicine Visit from 01/14/2021 in [facility]   Temp -- -- --   Pulse -- -- --   BP -- -- --   Resp -- -- --   Weight 180 lb (81.6 kg) * [Pt reported] 180 lb (81.6 kg) 189 lb (85.7 kg)             GAD 7 Score:    05/29/2021  8:23 AM   Gad-7 Total (!) 16     PHQ-9 Total Score (Auto Calculated) (!) 19  at 05/29/2021  8:23 AM       Assessment   40 year old male with a history of Bipolar disorder, PTSD, persistent complex bereavement disorder, anxiety, and ADHD who reports struggling with poor sleep, anxiety, panic attacks, and irritability. He remains motivated for treatment.     Lab results were reviewed with Patient/Caregiver, if applicable. Medications were reviewed. Medication education including potential side effects and risk/benefit were discussed with the Patient/Caregiver. Patient/Caregiver verbalized understanding and compliance. A treatment plan was developed/augmented and agreed upon by all parties. Discussed target symptoms and goals.      Plan   Bipolar disorder: Continue Lamictal 250 mg daily and increase Vraylar to 4.5mg  daily   Bipolar disorder, anxiety, PTSD: Continue Lexapro 15 mg daily  Bipolar disorder, anxiety, PTSD, persistent complex bereavement disorder: Continue Valium 10 mg daily prn. Reviewed PDMP.   ADHD: Increase Adderall 10 mg BID  ADHD and Anxiety: Continue Clonidine 0.1mg  TID prn   Encouraged to continue engaging in for individual therapy   Safety Agreement: Patient instructed to call their therapist, local crisis line, 911, or go to the nearest Emergency Room if in crisis.   Return in about 4 weeks (around 06/26/2021).      No orders of the defined types were placed in this encounter.      30 minutes of medical evaluation and care delivered.    Oletha Blend, PA-C  05/29/2021  11:49 PM PST          *Some images could not be shown.

## 2021-09-08 NOTE — ED Provider Notes (Signed)
[facility] EMERGENCY [facility]       Patient Name: Johnny Perez  DOB: 1980/11/03  RUE:[id number]  NWG:NFAOZHY R. Johnny Hockey, PA-C  Mode of Arrival: Walk-in  Accompanied by: Self  Room:        CHIEF COMPLAINT    Chief Complaint   Patient presents with   . Skin Problem       HPI    Johnny Perez is a 41 y.o. male who presents to the emergency department with complaint(s) of swelling and tenderness to the left side of his face.  Patient had a small papule to this area which he tried to pop.  Now the area has become enlarged and tender.    PAST MEDICAL HISTORY  Past Medical History:   Diagnosis Date   . ADHD    . Back pain    . Bipolar 1 disorder (HCC)    . Diverticulitis    . Manic depression (HCC)    . PTSD (post-traumatic stress disorder)        PAST SURGICAL HISTORY  No past surgical history on file.      SOCIAL HISTORY  Social History     Tobacco Use   . Smoking status: Every Day     Packs/day: 0.25     Types: Cigarettes   . Smokeless tobacco: Never   Vaping Use   . Vaping Use: Never used   Substance Use Topics   . Alcohol use: No     Comment: sbirt 07/23/16   . Drug use: Yes     Types: Marijuana       CURRENT MEDICATIONS  PTA Home Medications   Medication Sig   . gabapentin (NEURONTIN) 100 mg capsule Take 1 capsule by mouth nightly. (Patient not taking: Reported on 09/05/2019.)   . hydrOXYzine hydrochloride (ATARAX) 25 mg tablet Take 1 tablet by mouth every 6 hours as needed for Itching. (Patient not taking: Reported on 09/05/2019.)   . ondansetron (ZOFRAN ODT) 4 mg disintegrating tablet Take 1 tablet by mouth every 8 hours as needed for Nausea. (Patient not taking: Reported on 04/19/2019.)   . PARoxetine (PAXIL) 40 MG tablet Take 1 tablet by mouth every morning. (Patient not taking: Reported on 04/19/2019.)   . pseudoePHEDrine (SUDAFED) 60 MG tablet Take 1 tablet by mouth every 6 hours as needed for Congestion.   . sucralfate (CARAFATE) 1 g tablet Take 1 tablet by mouth 2 times daily.  (Patient not taking: Reported on 09/05/2019.)       ALLERGIES  No Known Allergies        REVIEW OF SYSTEMS    Review of Systems   Skin:         See HPI   All other systems reviewed and are negative.     PHYSICAL EXAM    VITAL SIGNS: (first vital signs):Temp: 36.8 ?C (98.2 ?F) Pulse: 99 Resp: 20 SpO2: 99 % BP: 145/88  Physical Exam  Constitutional:       Appearance: Normal appearance.   HENT:      Head:      Comments: 2 cm swelling to the left mid face just lateral to the nose.  There appears to be some abrasion of the skin no pointing or fluctuance of the area.  There is moderate induration and erythema associated  Eyes:      Extraocular Movements: Extraocular movements intact.      Comments: No evidence of periorbital swelling   Musculoskeletal:  General: Normal range of motion.   Neurological:      Mental Status: He is alert and oriented to person, place, and time.   Psychiatric:         Behavior: Behavior normal.           ED COURSE & MEDICAL DECISION MAKING    Johnny Perez presented to the Emergency Department for evaluation, and he was triaged to room . I reviewed the nursing notes, and he was evaluated by me.      Pertinent labs and imaging studies were reviewed by me.    42 year old male with swelling to the left mid face associated with a 2 cm papule.  There is skin abrasion over the area where patient has attempted to pop the lesion.  There is no evidence of fluctuance or drainage to the area.  Patient will be started on antibiotics to cover both staph and strep infection.  I have discussed discharge instructions to patient including no further manual manipulation to the lesion    This document has been prepared using voice recognition system.  Any spelling or grammatic errors associated within this document may simply represent the inherent error of the system.  These errors do not represent any deficiency in the care and treatment of this patient       Medications - No data to display    Last  Set of Vital Signs: Temp: 36.8 ?C (98.2 ?F) Pulse: 99 Resp: 20 SpO2: 99 % BP: 145/88    FINAL IMPRESSION    Final diagnoses:   Facial cellulitis          ED Disposition     ED Disposition   Discharge    Condition   Good    Comment   --                   ED Prescriptions         Sig    doxycycline hyclate (VIBRAMYCIN) 100 mg capsule Take 1 capsule by mouth 2 times daily for 10 days.              A public health emergency was declared for the COVID-19 pandemic.   To mitigate the transmission of the respiratory disease additional supplies, materials and ER staff time was required over and above those usually incurred.     Portions of this chart may have been created with voice recognition software. Occasional wrong-word or sound-alike substitutions may have occurred due to the inherent limitations of voice recognition software. Read the chart carefully and recognize, using context, where these substitutions have occurred.           Janace Aris, MD  09/08/21 1500

## 2021-10-16 LAB — COMPREHENSIVE METABOLIC PANEL
ALT - Alanine Aminotransferase: 13 IU/L (ref 7–52)
AST - Aspartate Aminotransferase: 12 IU/L (ref 10–50)
Albumin/Globulin Ratio: 1.6 (ref >0.9)
Albumin: 4.2 g/dL (ref 3.5–5.0)
Alkaline Phosphatase: 115 IU/L — ABNORMAL HIGH (ref 34–104)
Anion Gap: 7 mmol/L (ref 4–13)
BUN: 11 mg/dL (ref 6–23)
Bilirubin Total: 0.5 mg/dL (ref 0.3–1.2)
CO2 - Carbon Dioxide: 28 mmol/L (ref 21–31)
Calcium: 8.9 mg/dL (ref 8.6–10.3)
Chloride: 106 mmol/L (ref 98–111)
Creatinine: 0.79 mg/dL (ref 0.65–1.30)
Globulin: 2.6 g/dL (ref 2.0–3.7)
Glomerular Filtration Rate Estimate (Male): >60 mL/min/{1.73_m2} (ref >=60)
Glucose: 96 mg/dL (ref 80–99)
Potassium: 4 mmol/L (ref 3.5–5.1)
Protein Total: 6.8 g/dL (ref 6.0–8.0)
Sodium: 141 mmol/L (ref 135–143)

## 2021-10-16 LAB — HIV ANTIGEN & ANTIBODY SCREEN: HIV1/HIV2 Antibody/Antigen Screen: NONREACTIVE

## 2021-10-16 LAB — CBC WITH AUTO DIFFERENTIAL
Basophils %: 1 % (ref 0–2)
Basophils, Absolute: 0.1 10*3/ÂµL (ref 0.0–0.2)
Eosinophils %: 4 % (ref 0–7)
Eosinophils, Absolute: 0.3 10*3/ÂµL (ref 0.0–0.7)
HCT: 46.5 % (ref 42.0–54.0)
Hemoglobin: 15.5 g/dL (ref 12.0–18.0)
Lymphocytes %: 30 % (ref 25–45)
Lymphocytes, Absolute: 2.3 10*3/ÂµL (ref 1.1–4.3)
MCH: 29.5 pg (ref 27.0–34.0)
MCHC: 33.4 g/dL (ref 32.0–36.0)
MCV: 88.4 fL (ref 81.0–99.0)
MPV: 9.6 fL (ref 7.4–10.4)
Monocytes %: 10 % (ref 0–12)
Monocytes, Absolute: 0.8 10*3/ÂµL (ref 0.0–1.2)
Neutrophils %: 56 % (ref 35–70)
Neutrophils, Absolute: 4.3 10*3/ÂµL (ref 1.6–7.3)
Platelet Count: 308 10*3/ÂµL (ref 150–400)
RBC: 5.26 10*6/ÂµL (ref 4.70–6.10)
RDW: 13.9 % (ref 11.5–14.5)
WBC: 7.7 10*3/ÂµL (ref 4.8–10.8)

## 2021-10-16 LAB — TSH: TSH - Thyroid Stimulating Hormone: 1.13 ÂµIU/mL (ref 0.45–5.33)

## 2021-10-16 LAB — VITAMIN B12/FOLATE
Folate: 8.6 ng/mL (ref 5.9–24.8)
Vitamin B12: 146 pg/mL — ABNORMAL LOW (ref 180–914)

## 2021-10-16 LAB — GLYCO-HEMOGLOBIN A1C
Estimated Average Glucose: 120 mg/dL
Glycohemoglobin (A1c): 5.8 % (ref 4.3–6.1)

## 2021-10-16 LAB — 25-OH HYDROXY VITAMIN D, CALCIFEROL, TOTAL OF D2 & D3: Vitamin D, 25-OH Total: <7 ng/mL — ABNORMAL LOW (ref 30.0–100.0)

## 2021-10-16 LAB — CORONARY RISK LIPID PANEL REFLEX DIRECT LDL
Cholesterol, HDL: 37 mg/dL — ABNORMAL LOW (ref >=40)
Cholesterol: 184 mg/dL (ref <200)
LDL Calculated: 131 mg/dL — ABNORMAL HIGH (ref <100)
Triglyceride: 78 mg/dL (ref 30–149)

## 2021-10-16 LAB — HEPATITIS C ANTIBODY W/ REFLEX HCV RNA PCR (ASA): Hepatitis C Antibody Interpretation: NEGATIVE

## 2022-09-29 ENCOUNTER — Encounter: Attending: DO | Primary: Medical

## 2022-12-02 ENCOUNTER — Inpatient Hospital Stay: Admit: 2022-12-02 | Discharge: 2022-12-02 | Disposition: A | Discharge status: Home / Self Care | Attending: MD

## 2022-12-02 ENCOUNTER — Emergency Department: Admit: 2022-12-02 | Discharge: 2022-12-23 | Primary: Medical

## 2022-12-02 DIAGNOSIS — S060XAA Concussion with loss of consciousness status unknown, initial encounter: Secondary | ICD-10-CM

## 2022-12-02 LAB — CBC WITH AUTO DIFFERENTIAL
Basophils %: 1 % (ref 0–2)
Basophils, Absolute: 0.1 10*3/?L (ref 0.0–0.1)
Eosinophils %: 3 % (ref 0–5)
Eosinophils, Absolute: 0.2 10*3/?L (ref 0.0–0.4)
HCT: 40.3 % (ref 39.0–54.0)
Hemoglobin: 13.3 g/dL — ABNORMAL LOW (ref 13.8–17.8)
Lymphocytes %: 33 % (ref 20–40)
Lymphocytes, Absolute: 2.5 10*3/?L (ref 0.9–3.6)
MCH: 27.7 pg (ref 27.0–33.7)
MCHC: 33 g/dL (ref 33.0–35.8)
MCV: 84 fL (ref 83.0–98.0)
MPV: 10 fL (ref 6.8–10.2)
Monocytes %: 13 % — ABNORMAL HIGH (ref 3–12)
Monocytes, Absolute: 1 10*3/?L — ABNORMAL HIGH (ref 0.2–0.9)
Neutrophils %: 51 % (ref 50–77)
Neutrophils, Absolute: 3.9 10*3/?L (ref 2.0–8.4)
Platelet Count: 280 10*3/?L (ref 150–400)
RBC: 4.81 10*6/?L (ref 4.20–5.90)
RDW: 14 % (ref 11.7–16.4)
WBC: 7.6 10*3/?L (ref 4.5–11.0)

## 2022-12-02 LAB — COMPREHENSIVE METABOLIC PANEL
ALT - Alanine Aminotransferase: 37 IU/L (ref 5–41)
AST - Aspartate Aminotransferase: 33 IU/L (ref 5–40)
Albumin/Globulin Ratio: 1.2 (ref 1.2–2.2)
Albumin: 3.4 g/dL — ABNORMAL LOW (ref 3.5–5.0)
Alkaline Phosphatase: 167 IU/L — ABNORMAL HIGH (ref 40–130)
Anion Gap: 9 mmol/L (ref 9.0–18.0)
BUN / Creatinine Ratio: 32.3 — ABNORMAL HIGH (ref 12.0–20.0)
BUN: 10 mg/dL (ref 8–20)
Bilirubin Total: 0.7 mg/dL (ref 0.10–1.70)
CO2 - Carbon Dioxide: 24 mmol/L (ref 17–27)
Calcium: 9.3 mg/dL (ref 8.6–10.6)
Chloride: 108 mmol/L (ref 101.0–111.0)
Creatinine: 0.31 mg/dL — ABNORMAL LOW (ref 0.70–1.20)
Glomerular Filtration Rate Estimate (Male): >90 mL/min/{1.73_m2} (ref >=60)
Glucose: 96 mg/dL (ref 74–106)
Osmolality Calculation: 280.2 mOsm/kg (ref 275.0–300.0)
Potassium: 4.15 mmol/L (ref 3.50–5.10)
Protein Total: 6.3 g/dL — ABNORMAL LOW (ref 6.4–8.2)
Sodium: 141 mmol/L (ref 135–145)

## 2022-12-02 LAB — THC CONFIRMATION, URINE (LABCORP)
Cannabinoids Confirmation: POSITIVE
Carboxy-THC: 226 ng/mg creat

## 2022-12-02 LAB — DRUGS OF ABUSE - 7 PANEL URINE (SLMC)
Amphetamine: NEGATIVE
Barbiturates: NEGATIVE
Benzodiazepines: NEGATIVE
Cocaine: NEGATIVE
Marijuana: REACTIVE — AB
Opiates: NEGATIVE
PCP (Phencyclidine): NEGATIVE

## 2022-12-02 LAB — ALCOHOL, SERUM: Alcohol Serum: <0.01 g% (ref <0.010)

## 2022-12-02 MED ORDER — apixaban (ELIQUIS) tablet 5 mg
5 | Freq: Two times a day (BID) | ORAL | Status: DC
Start: 2022-12-02 — End: 2022-12-02

## 2022-12-02 MED ORDER — ondansetron (ZOFRAN) 4 mg tablet
4 | ORAL_TABLET | Freq: Four times a day (QID) | ORAL | 0 refills | Status: AC
Start: 2022-12-02 — End: 2022-12-09

## 2022-12-02 NOTE — ED Provider Notes (Signed)
[facility] Emergency Department Encounter        Chief Complaint   Patient presents with    Dizziness    Loss of Consciousness       HPI:  Johnny Perez is a 42 y.o. male who presents to the emergency department for evaluation of head injury.  The patient is a 42 year old gentleman who rolled out of bed early this morning he was actually on the couch asleep hit his head on the coffee table went to bed and did not wake up till noon he was dizzy wife was unsure if he had lost consciousness and he was fuzzy and out of whack.  Wife called EMS and had him sent to the ER.  Patient states he has somewhat of a headache    PMH:  History reviewed. No pertinent past medical history.    FH:  Non-contributory    SOC:  The patient lives locally and is followed by Evelina Bucy, PA.   reports that he has never smoked. He does not have any smokeless tobacco history on file. He reports that he does not currently use alcohol. He reports that he does not currently use drugs.    Allergies:  No Known Allergies    Home Medications:  Prior to Admission medications    Medication Sig Start Date End Date Taking? Authorizing Provider   cariprazine (VRAYLAR) 4.5 mg Cap Take 1 capsule by mouth once daily. 06/16/21   Historical Provider, MD   ibuprofen (ADVIL) 600 MG tablet Take 1 tablet by mouth every 8 hours as needed for Pain. 12/31/19   Scharlene Gloss Difani, PA       ROS:  Please see the history of present illness and nurse's notes for pertinent positives and negatives.       Physical Exam:     Initial Vitals [12/02/22 1244]   BP (!) 130/94   Pulse 103   Resp 16   Temp 36.3 ?C (97.4 ?F)   SpO2 95 %       GEN:  This is a well-developed well-nourished 42 year old nontoxic vital signs above  HEENT: Pupils are equal, round and reactive to light, extraocular muscles appear to be intact, there is no scleral icterus or injection. Mucus membranes are moist.  No evidence of head trauma  CV: Regular rate and rhythm. No murmurs, rubs or  gallops.  RESP: Lungs are clear to auscultation bilaterally.  The patient is breathing comfortably and speaking in full sentences.  ABD: Soft, non-tender and non-distended without peritoneal signs.  BACK: Normal alignment, non-tender.  NEURO: Alert and oriented. Moves all 4 extremities equally.  EXTREMITY: Normal  SKIN: No rash         Differential Diagnosis:  Closed head injury concussion scalp contusion intoxication    ED Course:  Seen and examined evaluated head CT was done labs were done head CT and labs are all negative    Results:     CT head without contrast   Final Result by Francella Solian, MD (05/16 1352)   IMPRESSION:   No acute intracranial abnormality.      Dictated by: Francella Solian   Electronically Signed by: Francella Solian on 12/02/2022 1:52 PM      Report ID#: [id number]                Results for orders placed or performed during the hospital encounter of 12/02/22 (from the past 8 hour(s))   CBC with Auto Differential -STAT  Collection Time: 12/02/22  1:21 PM   Result Value Ref Range    WBC 7.6 4.5 - 11.0 10*3/?L    RBC 4.81 4.20 - 5.90 10*6/?L    Hemoglobin 13.3 (L) 13.8 - 17.8 g/dL    HCT 16.1 09.6 - 04.5 %    MCV 84.0 83.0 - 98.0 fL    MCH 27.7 27.0 - 33.7 pg    MCHC 33.0 33.0 - 35.8 g/dL    RDW 40.9 81.1 - 91.4 %    Platelet Count 280 150 - 400 10*3/?L    MPV 10.0 6.8 - 10.2 fL    Neutrophils % 51 50 - 77 %    Lymphocytes % 33 20 - 40 %    Monocytes % 13 (H) 3 - 12 %    Eosinophils % 3 0 - 5 %    Basophils % 1 0 - 2 %    Neutrophils, Absolute 3.9 2.0 - 8.4 10*3/?L    Lymphocytes, Absolute 2.5 0.9 - 3.6 10*3/?L    Monocytes, Absolute 1.0 (H) 0.2 - 0.9 10*3/?L    Eosinophils, Absolute 0.2 0.0 - 0.4 10*3/?L    Basophils, Absolute 0.1 0.0 - 0.1 10*3/?L   Comprehensive Metabolic Panel -STAT    Collection Time: 12/02/22  1:26 PM   Result Value Ref Range    Sodium 141 135 - 145 mmol/L    Potassium 4.15 3.50 - 5.10 mmol/L    Chloride 108.0 101.0 - 111.0 mmol/L    CO2 - Carbon Dioxide 24 17 - 27 mmol/L    BUN 10 8  - 20 mg/dL    Creatinine 7.82 (L) 0.70 - 1.20 mg/dL    Glucose 96 74 - 956 mg/dL    Calcium 9.3 8.6 - 21.3 mg/dL    AST - Aspartate Aminotransferase 33 5 - 40 IU/L    ALT - Alanine Aminotransferase 37 5 - 41 IU/L    Alkaline Phosphatase 167 (H) 40 - 130 IU/L    Protein Total 6.3 (L) 6.4 - 8.2 g/dL    Albumin 3.4 (L) 3.5 - 5.0 g/dL    Bilirubin Total 0.86 0.10 - 1.70 mg/dL    Anion Gap 9.0 9.0 - 18.0 mmol/L    Albumin/Globulin Ratio 1.2 1.2 - 2.2    Glomerular Filtration Rate Estimate (Male) >90 >=60 mL/min/1.55m*2    GFR Additional Info      BUN / Creatinine Ratio 32.3 (H) 12.0 - 20.0    Osmolality Calculation 280.2 275.0 - 300.0 mOsm/kg   Alcohol, Serum -STAT    Collection Time: 12/02/22  1:26 PM   Result Value Ref Range    Alcohol Serum <0.010 <0.010 g%       Medical Decision Making:     Patient has what appears to be simple scalp contusion head contusion secondary to hitting his head on the coffee table possible mild concussion plan is discharge home anti-inflammatories recheck primary care provider he was referred to [facility] adult primary care    Impression:    SNOMED CT(R)   1. Concussion with unknown loss of consciousness status, initial encounter  CONCUSSION INJURY OF BRAIN       Condition and Disposition:  Patient discharged home in improved condition. Very strict return precautions were provided. Patient is to follow-up with primary care provider and to call for an appointment. Patient was encouraged to return to emergency department for any return or worsening symptoms or other concerns. All questions answered.  Marland Kitchen  NOTE:  This dictation was produced using voice recognition software. Although effort has been made to minimize transcription errors, homonyms and other transcription errors may be present and may not truly reflect my intent.             Billie Ruddy, MD  12/02/22 1434

## 2022-12-02 NOTE — Discharge Instructions (Signed)
Use Zofran as needed for nausea.  Use anti-inflammatories for headache the list above is for your primary care providers contact them to get established.  Return to the ER for any other problems

## 2022-12-02 NOTE — ED Notes (Signed)
Pt to CT

## 2022-12-02 NOTE — ED Triage Notes (Signed)
Pt came in via EMS. Pt reports hit head on coffee table after falling asleep and rolled off  couch about 0400. Posterior L side of head is tender and throbbing. Per wife, pt LOC. But pt came too and went to bed. Pt not on blood thinners. When woke up again about noon, pt reports dizziness. Pt unsure if he lost consciousness in bathroom about an hour ago. Everything seems fuzzy out of whack. So wife called EMS.

## 2022-12-02 NOTE — ED Notes (Signed)
Pt ambulatory back to room. Pt denies needs. Call light within reach.
